# Patient Record
Sex: Female | Born: 1935 | Race: White | Hispanic: No | Smoking: Former smoker
Health system: Southern US, Community
[De-identification: ages and names within clinical notes are randomized; demographics above are authoritative.]

## PROBLEM LIST (undated history)

## (undated) DIAGNOSIS — I429 Cardiomyopathy, unspecified: Secondary | ICD-10-CM

## (undated) DIAGNOSIS — I739 Peripheral vascular disease, unspecified: Secondary | ICD-10-CM

## (undated) DIAGNOSIS — I2510 Atherosclerotic heart disease of native coronary artery without angina pectoris: Secondary | ICD-10-CM

## (undated) DIAGNOSIS — N189 Chronic kidney disease, unspecified: Secondary | ICD-10-CM

## (undated) DIAGNOSIS — E559 Vitamin D deficiency, unspecified: Secondary | ICD-10-CM

## (undated) DIAGNOSIS — E039 Hypothyroidism, unspecified: Secondary | ICD-10-CM

## (undated) DIAGNOSIS — E538 Deficiency of other specified B group vitamins: Secondary | ICD-10-CM

## (undated) DIAGNOSIS — I10 Essential (primary) hypertension: Secondary | ICD-10-CM

## (undated) DIAGNOSIS — E785 Hyperlipidemia, unspecified: Secondary | ICD-10-CM

## (undated) HISTORY — PX: OTHER SURGICAL HISTORY: SHX169

## (undated) HISTORY — PX: ABDOMINAL AORTIC ANEURYSM REPAIR: SUR1152

## (undated) HISTORY — PX: CARDIAC SURGERY: SHX584

---

## 2010-08-12 ENCOUNTER — Ambulatory Visit: Payer: Self-pay | Admitting: Ophthalmology

## 2011-08-05 ENCOUNTER — Ambulatory Visit: Payer: Self-pay | Admitting: Internal Medicine

## 2012-09-15 ENCOUNTER — Ambulatory Visit: Payer: Self-pay | Admitting: Internal Medicine

## 2013-03-16 ENCOUNTER — Ambulatory Visit: Payer: Self-pay | Admitting: Internal Medicine

## 2013-09-27 ENCOUNTER — Inpatient Hospital Stay: Payer: Self-pay | Admitting: Surgery

## 2013-09-28 LAB — BASIC METABOLIC PANEL
Anion Gap: 6 — ABNORMAL LOW (ref 7–16)
BUN: 31 mg/dL — ABNORMAL HIGH (ref 7–18)
CALCIUM: 9 mg/dL (ref 8.5–10.1)
CHLORIDE: 107 mmol/L (ref 98–107)
CO2: 26 mmol/L (ref 21–32)
CREATININE: 2.01 mg/dL — AB (ref 0.60–1.30)
EGFR (African American): 25 — ABNORMAL LOW
EGFR (Non-African Amer.): 21 — ABNORMAL LOW
Glucose: 95 mg/dL (ref 65–99)
OSMOLALITY: 284 (ref 275–301)
Potassium: 4.4 mmol/L (ref 3.5–5.1)
SODIUM: 139 mmol/L (ref 136–145)

## 2013-09-28 LAB — CBC WITH DIFFERENTIAL/PLATELET
Basophil #: 0 10*3/uL (ref 0.0–0.1)
Basophil %: 0.7 %
Eosinophil #: 0 10*3/uL (ref 0.0–0.7)
Eosinophil %: 1 %
HCT: 33.9 % — AB (ref 35.0–47.0)
HGB: 11.6 g/dL — AB (ref 12.0–16.0)
Lymphocyte #: 0.6 10*3/uL — ABNORMAL LOW (ref 1.0–3.6)
Lymphocyte %: 11.2 %
MCH: 34.9 pg — ABNORMAL HIGH (ref 26.0–34.0)
MCHC: 34.3 g/dL (ref 32.0–36.0)
MCV: 102 fL — ABNORMAL HIGH (ref 80–100)
Monocyte #: 0.7 x10 3/mm (ref 0.2–0.9)
Monocyte %: 13.8 %
NEUTROS ABS: 3.7 10*3/uL (ref 1.4–6.5)
NEUTROS PCT: 73.3 %
Platelet: 152 10*3/uL (ref 150–440)
RBC: 3.33 10*6/uL — ABNORMAL LOW (ref 3.80–5.20)
RDW: 13.5 % (ref 11.5–14.5)
WBC: 5 10*3/uL (ref 3.6–11.0)

## 2013-09-28 LAB — TSH: Thyroid Stimulating Horm: 1.12 u[IU]/mL

## 2013-09-29 LAB — BASIC METABOLIC PANEL
Anion Gap: 9 (ref 7–16)
BUN: 27 mg/dL — AB (ref 7–18)
CALCIUM: 8.9 mg/dL (ref 8.5–10.1)
CHLORIDE: 110 mmol/L — AB (ref 98–107)
CO2: 22 mmol/L (ref 21–32)
Creatinine: 1.99 mg/dL — ABNORMAL HIGH (ref 0.60–1.30)
EGFR (African American): 25 — ABNORMAL LOW
EGFR (Non-African Amer.): 22 — ABNORMAL LOW
Glucose: 87 mg/dL (ref 65–99)
Osmolality: 286 (ref 275–301)
POTASSIUM: 4.3 mmol/L (ref 3.5–5.1)
Sodium: 141 mmol/L (ref 136–145)

## 2013-09-30 LAB — CBC WITH DIFFERENTIAL/PLATELET
BASOS ABS: 0 10*3/uL (ref 0.0–0.1)
BASOS PCT: 0.5 %
EOS PCT: 0.4 %
Eosinophil #: 0 10*3/uL (ref 0.0–0.7)
HCT: 33.7 % — ABNORMAL LOW (ref 35.0–47.0)
HGB: 11.4 g/dL — AB (ref 12.0–16.0)
Lymphocyte #: 0.6 10*3/uL — ABNORMAL LOW (ref 1.0–3.6)
Lymphocyte %: 12 %
MCH: 34.6 pg — AB (ref 26.0–34.0)
MCHC: 33.7 g/dL (ref 32.0–36.0)
MCV: 103 fL — AB (ref 80–100)
MONOS PCT: 9.2 %
Monocyte #: 0.4 x10 3/mm (ref 0.2–0.9)
Neutrophil #: 3.6 10*3/uL (ref 1.4–6.5)
Neutrophil %: 77.9 %
PLATELETS: 124 10*3/uL — AB (ref 150–440)
RBC: 3.29 10*6/uL — ABNORMAL LOW (ref 3.80–5.20)
RDW: 12.7 % (ref 11.5–14.5)
WBC: 4.6 10*3/uL (ref 3.6–11.0)

## 2013-09-30 LAB — COMPREHENSIVE METABOLIC PANEL
ALBUMIN: 2.9 g/dL — AB (ref 3.4–5.0)
ALK PHOS: 38 U/L — AB
ALT: 16 U/L (ref 12–78)
ANION GAP: 10 (ref 7–16)
AST: 23 U/L (ref 15–37)
BUN: 24 mg/dL — ABNORMAL HIGH (ref 7–18)
Bilirubin,Total: 0.5 mg/dL (ref 0.2–1.0)
CO2: 20 mmol/L — AB (ref 21–32)
Calcium, Total: 8.3 mg/dL — ABNORMAL LOW (ref 8.5–10.1)
Chloride: 110 mmol/L — ABNORMAL HIGH (ref 98–107)
Creatinine: 1.87 mg/dL — ABNORMAL HIGH (ref 0.60–1.30)
EGFR (Non-African Amer.): 23 — ABNORMAL LOW
GFR CALC AF AMER: 27 — AB
Glucose: 65 mg/dL (ref 65–99)
Osmolality: 282 (ref 275–301)
Potassium: 3.9 mmol/L (ref 3.5–5.1)
Sodium: 140 mmol/L (ref 136–145)
Total Protein: 6.2 g/dL — ABNORMAL LOW (ref 6.4–8.2)

## 2013-10-01 LAB — CBC WITH DIFFERENTIAL/PLATELET
BASOS ABS: 0 10*3/uL (ref 0.0–0.1)
Basophil %: 0.6 %
EOS PCT: 4.8 %
Eosinophil #: 0.3 10*3/uL (ref 0.0–0.7)
HCT: 32.7 % — ABNORMAL LOW (ref 35.0–47.0)
HGB: 11.1 g/dL — AB (ref 12.0–16.0)
LYMPHS ABS: 0.7 10*3/uL — AB (ref 1.0–3.6)
Lymphocyte %: 12.3 %
MCH: 34.6 pg — ABNORMAL HIGH (ref 26.0–34.0)
MCHC: 33.9 g/dL (ref 32.0–36.0)
MCV: 102 fL — ABNORMAL HIGH (ref 80–100)
MONOS PCT: 10.9 %
Monocyte #: 0.6 x10 3/mm (ref 0.2–0.9)
NEUTROS PCT: 71.4 %
Neutrophil #: 4 10*3/uL (ref 1.4–6.5)
Platelet: 129 10*3/uL — ABNORMAL LOW (ref 150–440)
RBC: 3.2 10*6/uL — ABNORMAL LOW (ref 3.80–5.20)
RDW: 13.1 % (ref 11.5–14.5)
WBC: 5.6 10*3/uL (ref 3.6–11.0)

## 2013-10-01 LAB — BASIC METABOLIC PANEL
ANION GAP: 6 — AB (ref 7–16)
BUN: 20 mg/dL — ABNORMAL HIGH (ref 7–18)
CALCIUM: 8.2 mg/dL — AB (ref 8.5–10.1)
CHLORIDE: 106 mmol/L (ref 98–107)
CREATININE: 1.77 mg/dL — AB (ref 0.60–1.30)
Co2: 25 mmol/L (ref 21–32)
EGFR (African American): 29 — ABNORMAL LOW
EGFR (Non-African Amer.): 25 — ABNORMAL LOW
Glucose: 114 mg/dL — ABNORMAL HIGH (ref 65–99)
Osmolality: 277 (ref 275–301)
POTASSIUM: 3.6 mmol/L (ref 3.5–5.1)
Sodium: 137 mmol/L (ref 136–145)

## 2013-10-02 LAB — CBC WITH DIFFERENTIAL/PLATELET
Basophil #: 0 10*3/uL (ref 0.0–0.1)
Basophil %: 0.5 %
Eosinophil #: 0.2 10*3/uL (ref 0.0–0.7)
Eosinophil %: 3.9 %
HCT: 36.2 % (ref 35.0–47.0)
HGB: 12.3 g/dL (ref 12.0–16.0)
Lymphocyte #: 0.8 10*3/uL — ABNORMAL LOW (ref 1.0–3.6)
Lymphocyte %: 15.3 %
MCH: 34.3 pg — ABNORMAL HIGH (ref 26.0–34.0)
MCHC: 33.9 g/dL (ref 32.0–36.0)
MCV: 101 fL — AB (ref 80–100)
MONO ABS: 0.6 x10 3/mm (ref 0.2–0.9)
Monocyte %: 10.9 %
Neutrophil #: 3.5 10*3/uL (ref 1.4–6.5)
Neutrophil %: 69.4 %
Platelet: 128 10*3/uL — ABNORMAL LOW (ref 150–440)
RBC: 3.57 10*6/uL — ABNORMAL LOW (ref 3.80–5.20)
RDW: 13.1 % (ref 11.5–14.5)
WBC: 5 10*3/uL (ref 3.6–11.0)

## 2013-10-02 LAB — BASIC METABOLIC PANEL
Anion Gap: 7 (ref 7–16)
BUN: 13 mg/dL (ref 7–18)
CO2: 24 mmol/L (ref 21–32)
CREATININE: 1.64 mg/dL — AB (ref 0.60–1.30)
Calcium, Total: 8.5 mg/dL (ref 8.5–10.1)
Chloride: 107 mmol/L (ref 98–107)
EGFR (African American): 32 — ABNORMAL LOW
GFR CALC NON AF AMER: 27 — AB
GLUCOSE: 97 mg/dL (ref 65–99)
OSMOLALITY: 276 (ref 275–301)
Potassium: 4.2 mmol/L (ref 3.5–5.1)
Sodium: 138 mmol/L (ref 136–145)

## 2013-10-03 LAB — CBC WITH DIFFERENTIAL/PLATELET
Basophil #: 0 10*3/uL (ref 0.0–0.1)
Basophil %: 0.1 %
Eosinophil #: 0 10*3/uL (ref 0.0–0.7)
Eosinophil %: 0 %
HCT: 34 % — ABNORMAL LOW (ref 35.0–47.0)
HGB: 11.3 g/dL — ABNORMAL LOW (ref 12.0–16.0)
LYMPHS ABS: 0.6 10*3/uL — AB (ref 1.0–3.6)
Lymphocyte %: 8.4 %
MCH: 34 pg (ref 26.0–34.0)
MCHC: 33.3 g/dL (ref 32.0–36.0)
MCV: 102 fL — AB (ref 80–100)
MONO ABS: 0.8 x10 3/mm (ref 0.2–0.9)
MONOS PCT: 11.7 %
NEUTROS ABS: 5.5 10*3/uL (ref 1.4–6.5)
Neutrophil %: 79.8 %
Platelet: 150 10*3/uL (ref 150–440)
RBC: 3.33 10*6/uL — AB (ref 3.80–5.20)
RDW: 13.1 % (ref 11.5–14.5)
WBC: 6.8 10*3/uL (ref 3.6–11.0)

## 2013-10-03 LAB — BASIC METABOLIC PANEL
ANION GAP: 4 — AB (ref 7–16)
BUN: 15 mg/dL (ref 7–18)
CHLORIDE: 107 mmol/L (ref 98–107)
CO2: 24 mmol/L (ref 21–32)
Calcium, Total: 7.9 mg/dL — ABNORMAL LOW (ref 8.5–10.1)
Creatinine: 1.72 mg/dL — ABNORMAL HIGH (ref 0.60–1.30)
EGFR (Non-African Amer.): 26 — ABNORMAL LOW
GFR CALC AF AMER: 30 — AB
GLUCOSE: 110 mg/dL — AB (ref 65–99)
Osmolality: 272 (ref 275–301)
Potassium: 5.3 mmol/L — ABNORMAL HIGH (ref 3.5–5.1)
Sodium: 135 mmol/L — ABNORMAL LOW (ref 136–145)

## 2013-10-04 LAB — MAGNESIUM: MAGNESIUM: 1.2 mg/dL — AB

## 2013-10-04 LAB — CBC WITH DIFFERENTIAL/PLATELET
BASOS PCT: 0.5 %
Basophil #: 0 10*3/uL (ref 0.0–0.1)
EOS ABS: 0.3 10*3/uL (ref 0.0–0.7)
Eosinophil %: 6.7 %
HCT: 31.9 % — ABNORMAL LOW (ref 35.0–47.0)
HGB: 10.5 g/dL — AB (ref 12.0–16.0)
LYMPHS PCT: 18.9 %
Lymphocyte #: 0.9 10*3/uL — ABNORMAL LOW (ref 1.0–3.6)
MCH: 34 pg (ref 26.0–34.0)
MCHC: 33.1 g/dL (ref 32.0–36.0)
MCV: 103 fL — AB (ref 80–100)
MONO ABS: 0.6 x10 3/mm (ref 0.2–0.9)
MONOS PCT: 12.2 %
Neutrophil #: 2.8 10*3/uL (ref 1.4–6.5)
Neutrophil %: 61.7 %
Platelet: 135 10*3/uL — ABNORMAL LOW (ref 150–440)
RBC: 3.1 10*6/uL — AB (ref 3.80–5.20)
RDW: 13.1 % (ref 11.5–14.5)
WBC: 4.5 10*3/uL (ref 3.6–11.0)

## 2013-10-04 LAB — BASIC METABOLIC PANEL
Anion Gap: 2 — ABNORMAL LOW (ref 7–16)
BUN: 13 mg/dL (ref 7–18)
CALCIUM: 7.9 mg/dL — AB (ref 8.5–10.1)
CHLORIDE: 112 mmol/L — AB (ref 98–107)
CO2: 25 mmol/L (ref 21–32)
Creatinine: 1.65 mg/dL — ABNORMAL HIGH (ref 0.60–1.30)
EGFR (Non-African Amer.): 27 — ABNORMAL LOW
GFR CALC AF AMER: 32 — AB
Glucose: 84 mg/dL (ref 65–99)
OSMOLALITY: 277 (ref 275–301)
POTASSIUM: 4.5 mmol/L (ref 3.5–5.1)
SODIUM: 139 mmol/L (ref 136–145)

## 2013-10-04 LAB — PHOSPHORUS: Phosphorus: 2.4 mg/dL — ABNORMAL LOW (ref 2.5–4.9)

## 2013-10-05 LAB — BASIC METABOLIC PANEL
Anion Gap: 7 (ref 7–16)
BUN: 10 mg/dL (ref 7–18)
CALCIUM: 7.6 mg/dL — AB (ref 8.5–10.1)
Chloride: 112 mmol/L — ABNORMAL HIGH (ref 98–107)
Co2: 24 mmol/L (ref 21–32)
Creatinine: 1.38 mg/dL — ABNORMAL HIGH (ref 0.60–1.30)
EGFR (African American): 39 — ABNORMAL LOW
GFR CALC NON AF AMER: 34 — AB
Glucose: 97 mg/dL (ref 65–99)
OSMOLALITY: 284 (ref 275–301)
Potassium: 4.4 mmol/L (ref 3.5–5.1)
Sodium: 143 mmol/L (ref 136–145)

## 2013-10-05 LAB — CBC WITH DIFFERENTIAL/PLATELET
BASOS PCT: 0.7 %
Basophil #: 0 10*3/uL (ref 0.0–0.1)
EOS PCT: 4.9 %
Eosinophil #: 0.2 10*3/uL (ref 0.0–0.7)
HCT: 28.9 % — AB (ref 35.0–47.0)
HGB: 9.7 g/dL — AB (ref 12.0–16.0)
LYMPHS ABS: 0.5 10*3/uL — AB (ref 1.0–3.6)
Lymphocyte %: 12.1 %
MCH: 34.4 pg — AB (ref 26.0–34.0)
MCHC: 33.5 g/dL (ref 32.0–36.0)
MCV: 103 fL — AB (ref 80–100)
MONO ABS: 0.5 x10 3/mm (ref 0.2–0.9)
Monocyte %: 12.3 %
NEUTROS ABS: 3.1 10*3/uL (ref 1.4–6.5)
NEUTROS PCT: 70 %
Platelet: 137 10*3/uL — ABNORMAL LOW (ref 150–440)
RBC: 2.82 10*6/uL — AB (ref 3.80–5.20)
RDW: 13.3 % (ref 11.5–14.5)
WBC: 4.5 10*3/uL (ref 3.6–11.0)

## 2013-10-05 LAB — MAGNESIUM: Magnesium: 1.7 mg/dL — ABNORMAL LOW

## 2013-10-05 LAB — PHOSPHORUS: Phosphorus: 2.7 mg/dL (ref 2.5–4.9)

## 2013-10-06 LAB — BASIC METABOLIC PANEL
ANION GAP: 4 — AB (ref 7–16)
BUN: 7 mg/dL (ref 7–18)
Calcium, Total: 7.7 mg/dL — ABNORMAL LOW (ref 8.5–10.1)
Chloride: 115 mmol/L — ABNORMAL HIGH (ref 98–107)
Co2: 24 mmol/L (ref 21–32)
Creatinine: 1.24 mg/dL (ref 0.60–1.30)
EGFR (African American): 45 — ABNORMAL LOW
EGFR (Non-African Amer.): 38 — ABNORMAL LOW
Glucose: 100 mg/dL — ABNORMAL HIGH (ref 65–99)
Osmolality: 283 (ref 275–301)
POTASSIUM: 4.2 mmol/L (ref 3.5–5.1)
Sodium: 143 mmol/L (ref 136–145)

## 2013-10-06 LAB — CBC WITH DIFFERENTIAL/PLATELET
BASOS ABS: 0 10*3/uL (ref 0.0–0.1)
Basophil %: 1 %
EOS ABS: 0.2 10*3/uL (ref 0.0–0.7)
Eosinophil %: 5.2 %
HCT: 28.2 % — ABNORMAL LOW (ref 35.0–47.0)
HGB: 9.7 g/dL — ABNORMAL LOW (ref 12.0–16.0)
LYMPHS PCT: 15.4 %
Lymphocyte #: 0.6 10*3/uL — ABNORMAL LOW (ref 1.0–3.6)
MCH: 35.1 pg — ABNORMAL HIGH (ref 26.0–34.0)
MCHC: 34.4 g/dL (ref 32.0–36.0)
MCV: 102 fL — AB (ref 80–100)
Monocyte #: 0.5 x10 3/mm (ref 0.2–0.9)
Monocyte %: 12.5 %
NEUTROS ABS: 2.7 10*3/uL (ref 1.4–6.5)
Neutrophil %: 65.9 %
Platelet: 146 10*3/uL — ABNORMAL LOW (ref 150–440)
RBC: 2.76 10*6/uL — ABNORMAL LOW (ref 3.80–5.20)
RDW: 13.3 % (ref 11.5–14.5)
WBC: 4 10*3/uL (ref 3.6–11.0)

## 2013-10-07 LAB — CBC WITH DIFFERENTIAL/PLATELET
BASOS ABS: 0 10*3/uL (ref 0.0–0.1)
BASOS PCT: 0.4 %
EOS PCT: 1.5 %
Eosinophil #: 0.1 10*3/uL (ref 0.0–0.7)
HCT: 33.3 % — ABNORMAL LOW (ref 35.0–47.0)
HGB: 11.2 g/dL — AB (ref 12.0–16.0)
LYMPHS PCT: 8 %
Lymphocyte #: 0.5 10*3/uL — ABNORMAL LOW (ref 1.0–3.6)
MCH: 34.4 pg — ABNORMAL HIGH (ref 26.0–34.0)
MCHC: 33.6 g/dL (ref 32.0–36.0)
MCV: 102 fL — ABNORMAL HIGH (ref 80–100)
Monocyte #: 0.5 x10 3/mm (ref 0.2–0.9)
Monocyte %: 8.2 %
NEUTROS PCT: 81.9 %
Neutrophil #: 4.6 10*3/uL (ref 1.4–6.5)
Platelet: 182 10*3/uL (ref 150–440)
RBC: 3.25 10*6/uL — ABNORMAL LOW (ref 3.80–5.20)
RDW: 12.9 % (ref 11.5–14.5)
WBC: 5.6 10*3/uL (ref 3.6–11.0)

## 2013-10-07 LAB — BASIC METABOLIC PANEL
Anion Gap: 4 — ABNORMAL LOW (ref 7–16)
BUN: 8 mg/dL (ref 7–18)
CALCIUM: 8.3 mg/dL — AB (ref 8.5–10.1)
CHLORIDE: 107 mmol/L (ref 98–107)
Co2: 27 mmol/L (ref 21–32)
Creatinine: 1.37 mg/dL — ABNORMAL HIGH (ref 0.60–1.30)
EGFR (Non-African Amer.): 34 — ABNORMAL LOW
GFR CALC AF AMER: 40 — AB
Glucose: 110 mg/dL — ABNORMAL HIGH (ref 65–99)
Osmolality: 275 (ref 275–301)
Potassium: 4 mmol/L (ref 3.5–5.1)
SODIUM: 138 mmol/L (ref 136–145)

## 2013-10-08 LAB — CBC WITH DIFFERENTIAL/PLATELET
BASOS PCT: 0.4 %
Basophil #: 0 10*3/uL (ref 0.0–0.1)
Eosinophil #: 0.3 10*3/uL (ref 0.0–0.7)
Eosinophil %: 6.4 %
HCT: 31.1 % — ABNORMAL LOW (ref 35.0–47.0)
HGB: 10.5 g/dL — ABNORMAL LOW (ref 12.0–16.0)
LYMPHS PCT: 11.8 %
Lymphocyte #: 0.5 10*3/uL — ABNORMAL LOW (ref 1.0–3.6)
MCH: 34.3 pg — ABNORMAL HIGH (ref 26.0–34.0)
MCHC: 33.8 g/dL (ref 32.0–36.0)
MCV: 102 fL — AB (ref 80–100)
MONOS PCT: 9.9 %
Monocyte #: 0.4 x10 3/mm (ref 0.2–0.9)
Neutrophil #: 3.2 10*3/uL (ref 1.4–6.5)
Neutrophil %: 71.5 %
Platelet: 170 10*3/uL (ref 150–440)
RBC: 3.06 10*6/uL — ABNORMAL LOW (ref 3.80–5.20)
RDW: 13.3 % (ref 11.5–14.5)
WBC: 4.4 10*3/uL (ref 3.6–11.0)

## 2013-10-08 LAB — BASIC METABOLIC PANEL
Anion Gap: 5 — ABNORMAL LOW (ref 7–16)
BUN: 10 mg/dL (ref 7–18)
CALCIUM: 8.2 mg/dL — AB (ref 8.5–10.1)
Chloride: 107 mmol/L (ref 98–107)
Co2: 27 mmol/L (ref 21–32)
Creatinine: 1.32 mg/dL — ABNORMAL HIGH (ref 0.60–1.30)
EGFR (African American): 41 — ABNORMAL LOW
EGFR (Non-African Amer.): 36 — ABNORMAL LOW
GLUCOSE: 91 mg/dL (ref 65–99)
OSMOLALITY: 276 (ref 275–301)
Potassium: 3.7 mmol/L (ref 3.5–5.1)
Sodium: 139 mmol/L (ref 136–145)

## 2013-10-10 LAB — CBC WITH DIFFERENTIAL/PLATELET
Basophil #: 0 10*3/uL (ref 0.0–0.1)
Basophil %: 0.7 %
EOS ABS: 0.3 10*3/uL (ref 0.0–0.7)
EOS PCT: 5.2 %
HCT: 28.6 % — ABNORMAL LOW (ref 35.0–47.0)
HGB: 9.9 g/dL — AB (ref 12.0–16.0)
Lymphocyte #: 0.6 10*3/uL — ABNORMAL LOW (ref 1.0–3.6)
Lymphocyte %: 12.6 %
MCH: 34.5 pg — ABNORMAL HIGH (ref 26.0–34.0)
MCHC: 34.5 g/dL (ref 32.0–36.0)
MCV: 100 fL (ref 80–100)
Monocyte #: 0.5 x10 3/mm (ref 0.2–0.9)
Monocyte %: 10.5 %
Neutrophil #: 3.4 10*3/uL (ref 1.4–6.5)
Neutrophil %: 71 %
Platelet: 182 10*3/uL (ref 150–440)
RBC: 2.86 10*6/uL — AB (ref 3.80–5.20)
RDW: 13.3 % (ref 11.5–14.5)
WBC: 4.8 10*3/uL (ref 3.6–11.0)

## 2013-10-10 LAB — BASIC METABOLIC PANEL
Anion Gap: 1 — ABNORMAL LOW (ref 7–16)
BUN: 11 mg/dL (ref 7–18)
CALCIUM: 8.7 mg/dL (ref 8.5–10.1)
Chloride: 104 mmol/L (ref 98–107)
Co2: 30 mmol/L (ref 21–32)
Creatinine: 1.29 mg/dL (ref 0.60–1.30)
EGFR (African American): 43 — ABNORMAL LOW
EGFR (Non-African Amer.): 37 — ABNORMAL LOW
GLUCOSE: 93 mg/dL (ref 65–99)
OSMOLALITY: 269 (ref 275–301)
Potassium: 3.1 mmol/L — ABNORMAL LOW (ref 3.5–5.1)
SODIUM: 135 mmol/L — AB (ref 136–145)

## 2013-10-10 LAB — MAGNESIUM: MAGNESIUM: 1.2 mg/dL — AB

## 2013-10-11 LAB — POTASSIUM: POTASSIUM: 3.3 mmol/L — AB (ref 3.5–5.1)

## 2013-10-12 LAB — CBC WITH DIFFERENTIAL/PLATELET
BASOS ABS: 0 10*3/uL (ref 0.0–0.1)
BASOS PCT: 0.9 %
EOS PCT: 3.5 %
Eosinophil #: 0.2 10*3/uL (ref 0.0–0.7)
HCT: 31.6 % — AB (ref 35.0–47.0)
HGB: 10.6 g/dL — ABNORMAL LOW (ref 12.0–16.0)
LYMPHS PCT: 19.5 %
Lymphocyte #: 0.9 10*3/uL — ABNORMAL LOW (ref 1.0–3.6)
MCH: 33.5 pg (ref 26.0–34.0)
MCHC: 33.4 g/dL (ref 32.0–36.0)
MCV: 100 fL (ref 80–100)
Monocyte #: 0.4 x10 3/mm (ref 0.2–0.9)
Monocyte %: 8.7 %
NEUTROS PCT: 67.4 %
Neutrophil #: 3.2 10*3/uL (ref 1.4–6.5)
PLATELETS: 228 10*3/uL (ref 150–440)
RBC: 3.15 10*6/uL — ABNORMAL LOW (ref 3.80–5.20)
RDW: 13.1 % (ref 11.5–14.5)
WBC: 4.7 10*3/uL (ref 3.6–11.0)

## 2013-10-12 LAB — POTASSIUM: POTASSIUM: 3.6 mmol/L (ref 3.5–5.1)

## 2013-10-12 LAB — MAGNESIUM: Magnesium: 1.6 mg/dL — ABNORMAL LOW

## 2013-10-16 LAB — BASIC METABOLIC PANEL
Anion Gap: 6 — ABNORMAL LOW (ref 7–16)
BUN: 12 mg/dL (ref 7–18)
CALCIUM: 9.5 mg/dL (ref 8.5–10.1)
CO2: 28 mmol/L (ref 21–32)
Chloride: 103 mmol/L (ref 98–107)
Creatinine: 1.55 mg/dL — ABNORMAL HIGH (ref 0.60–1.30)
EGFR (African American): 34 — ABNORMAL LOW
GFR CALC NON AF AMER: 29 — AB
GLUCOSE: 85 mg/dL (ref 65–99)
OSMOLALITY: 273 (ref 275–301)
POTASSIUM: 4.3 mmol/L (ref 3.5–5.1)
SODIUM: 137 mmol/L (ref 136–145)

## 2013-10-16 LAB — CBC WITH DIFFERENTIAL/PLATELET
BASOS ABS: 0.1 10*3/uL (ref 0.0–0.1)
BASOS PCT: 1 %
EOS PCT: 2.8 %
Eosinophil #: 0.2 10*3/uL (ref 0.0–0.7)
HCT: 35.1 % (ref 35.0–47.0)
HGB: 11.9 g/dL — ABNORMAL LOW (ref 12.0–16.0)
LYMPHS ABS: 1.5 10*3/uL (ref 1.0–3.6)
Lymphocyte %: 28.4 %
MCH: 33.8 pg (ref 26.0–34.0)
MCHC: 34 g/dL (ref 32.0–36.0)
MCV: 100 fL (ref 80–100)
MONOS PCT: 8.5 %
Monocyte #: 0.5 x10 3/mm (ref 0.2–0.9)
Neutrophil #: 3.2 10*3/uL (ref 1.4–6.5)
Neutrophil %: 59.3 %
Platelet: 313 10*3/uL (ref 150–440)
RBC: 3.53 10*6/uL — ABNORMAL LOW (ref 3.80–5.20)
RDW: 13.3 % (ref 11.5–14.5)
WBC: 5.4 10*3/uL (ref 3.6–11.0)

## 2014-12-23 NOTE — Discharge Summary (Signed)
PATIENT NAME:  Alisha Schneider, [name] MR#:  [id number] DATE OF BIRTH:  [birth year]  DATE OF ADMISSION:  09/27/2013 DATE OF DISCHARGE: 10/13/2013   DIAGNOSES:  1.  Small bowel obstruction.  2.  Coronary artery disease. 3.  Hypothyroidism. 4.  Hyperlipidemia. 5.  Chronic renal failure. 6.  Peripheral vascular disease.   PROCEDURES: Exploratory laparotomy with lysis of adhesions by Dr. Michela PitcherEly.   HISTORY OF PRESENT ILLNESS AND HOSPITAL COURSE: This is a patient, who was admitted to the hospital on the 27th for a diagnosis of probable partial small bowel obstruction. A nasogastric tube was placed and she was monitored with repeated exams and abdominal x-rays. Ultimately, she failed to show progression towards improvement; therefore was taken to the operating room by Dr. Michela PitcherEly where lysis of adhesions was performed. Her small bowel obstruction was relieved. Postoperatively, she had a prolonged postoperative ileus requiring nasogastric tube suction, but ultimately the NG tube was removed and she is currently tolerating a regular diet. She is to be transferred to [facility] at her request and we will be discharged on her regular home medications with the addition of Vicodin 1 p.o. q.6 hours p.r.n. pain. Her staples and central line will be removed today and Steri-Strips with Mastisol will be placed. She can start physical therapy and with full weight-bearing and may shower and will follow up in our office with Dr. Michela PitcherEly in 10 days. ____________________________ Adah Salvageichard E. Excell Seltzerooper, MD rec:aw D: 10/13/2013 09:36:56 ET T: 10/13/2013 09:58:33 ET JOB#: [id number]  cc: Adah Salvageichard E. Excell Seltzerooper, MD, <Dictator> Lattie HawICHARD E COOPER MD ELECTRONICALLY SIGNED 10/13/2013 15:38

## 2014-12-23 NOTE — Op Note (Signed)
PATIENT NAME:  Alisha Schneider, Alisha Schneider MR#:  [id number] DATE OF BIRTH:  06-17-1922  DATE OF PROCEDURE:  10/02/2013  PREOPERATIVE DIAGNOSIS: Small bowel obstruction.   POSTOPERATIVE DIAGNOSIS: Small bowel obstruction.   OPERATION: Exploratory laparotomy, lysis of adhesions.   SURGEON: Carmie Endalph L. Ely III, M.D.   ANESTHESIA: General.   OPERATIVE PROCEDURE: With the patient in the supine position after induction of appropriate general anesthesia, the patient'Schneider abdomen was prepped with ChloraPrep and draped with sterile towels. An alcohol wipe and Betadine-impregnated Steri-Drape were utilized. A midline incision was made down through the subcutaneous tissue with Bovie electrocautery. Midline fascia was identified and opened the length of the skin incision. There were multiple adhesions of the anterior abdominal wall from the omentum which were taken down without difficulty. There was significant mismatch in the size of the bowel, and the proximal bowel was dissected down to a point in the pelvis where there was an obvious band adhesion constricting a portion of the distal small bowel. This area was lysed. The bowel did appear to be a bit compromised in that area, so the bowel was observed for 20 to 30 minutes and the bowel appeared to resolve its ischemic changes. There were multiple other small adhesions. Did not appear to be obstructive but were lysed to allow for reduction of future risk of bowel obstruction. The abdomen was copiously irrigated. Bowel contents were returned to their anatomic position. The midline fascia was closed with running suture of looped PDS starting at each end and tying in the middle. Skin was stapled. Sterile dressings were applied. The patient was returned to the recovery room, having tolerated the procedure well. Sponge, instrument and needle counts were correct x 2 in the operating room.   ____________________________ Quentin Orealph L. Ely III, MD rle:gb D: 10/12/2013 01:00:33  ET T: 10/12/2013 01:26:30 ET JOB#: [id number]  cc: Carmie Endalph L. Ely III, MD, <Dictator> Lynnea FerrierBert J. Klein III, MD Quentin OreALPH L ELY MD ELECTRONICALLY SIGNED 10/13/2013 4:01

## 2014-12-23 NOTE — Consult Note (Signed)
PATIENT NAME:  Alisha, Schneider MR#:  [id number] DATE OF BIRTH:  1922/04/06  DATE OF CONSULTATION:  09/29/2013  CONSULTING PHYSICIAN:  Cristal Deer A. Kimberlyann Hollar, MD  CHIEF COMPLAINT: Abdominal pain, nausea, vomiting, radiographic findings for small bowel obstruction.   HISTORY OF PRESENT ILLNESS:  Alisha Schneider is a pleasant 81 year old female who was seen on 01/27 in Dr. Odessa Fleming office for approximately 12 hours of diffuse severe abdominal pain, which came in waves, as well as nausea, vomiting and no bowel movements. She said that this began about 12 hours ago prior to admission.  She had never had an episode like that before and she tried a suppository without results.  She had a CT scan at that time, which showed dilated stomach and proximal loops of small bowel with a change in caliber in her pelvis, similar area as her previous sigmoid colectomy. She was initially treated with n.p.o. and had a bowel movement then her nausea and vomiting resolved and was begun on feeds yesterday, which she did not tolerate. Currently, is still having pain and had 1 episode of emesis approximately 1 hour ago. She says that her emesis makes her pain feel better. She has been offered NG tube prior to this, but has refused. No fevers, chills, night sweats, shortness of breath, cough, chest pain, dysuria or hematuria.   PAST MEDICAL HISTORY: 1. Coronary artery disease status post three-vessel CABG in 1987, status post stent placement in 1997, sees  cardiologist p.r.n.   2.  History of sigmoid colectomy for diverticulitis, date unknown.  3.  Chart has history of prior abdominal aortic aneurysm repair, but she does not recall this.  4.  Hypothyroidism.  5.  Hyperlipidemia.  6.  Vitamin D deficiency.  7.  History of cholelithiasis.  8.  History of chronic kidney disease stage III or IV.   HOME MEDICATIONS:  1.  Aspirin.  2.  Synthroid.  3.  Toprol.  4.  Vitamin D.  5.  Zocor.  6.  Hydrochlorothiazide.   ALLERGIES:  ACE INHIBITORS AND PENICILLIN.   SOCIAL HISTORY: Denies tobacco or alcohol use. Lives alone.   FAMILY HISTORY: Coronary artery disease.   REVIEW OF SYSTEMS: Twelve-point review of systems review of systems was obtained. Pertinent positives and negatives as above.   PHYSICAL EXAMINATION:  VITAL SIGNS:  Currently, temperature 98, pulse of 88, blood pressure 155/88, respirations 20, 90% on room air.  GENERAL: No acute distress.  Alert and oriented x 3.  HEAD: Normocephalic, atraumatic. EYES: No scleral icterus. No conjunctivitis.  FACE: No obvious facial trauma. Normal external nose. Normal external ears.  CHEST: Lungs clear to auscultation. Moving air well.  HEART: Regular rate and rhythm. No murmurs, rubs or gallops.  ABDOMEN:  Soft, mild distention, mild diffuse tenderness without peritoneal signs.  EXTREMITIES: Moves all extremities well. Strength 5/5.  NEUROLOGIC: Cranial nerves II through XII grossly intact. Sensation intact in all 4 extremities.   LABORATORY AND RADIOLOGICAL DATA: Significant for white cell count of 5.0 on January 28th. Platelets 152. Creatinine is 1.99, down from 2.1 on admission.   CT yesterday shows dilated proximal small bowel and change in caliber in pelvis. Significant surgical staples and clips in abdomen.  X-ray from today shows dilated stomach and air-filled small bowel, similar to prior scout image.   ASSESSMENT AND PLAN: This patient is a pleasant 81 year old female who presents with what seems to be persistent small bowel obstruction, likely due to adhesions from previous surgery. I have strongly recommended multiple  times that the potential benefit with NG decompression and I have quoted that with full obstruction she may have 50% chance of resolving this without surgery.  She adamantly refuses it at this time. However, I have been persistent, and I have asked nursing to continue to try to encourage her to have NG tube. I have explained the discomfort with  NG tube, but also that she may end up having an NG tube placed if she ends up undergoing surgery anyway.  We will continue to follow.   ____________________________ Si Raiderhristopher A. Kamirah Shugrue, MD cal:dmm D: 09/29/2013 20:59:32 ET T: 09/29/2013 22:14:02 ET JOB#: [id number]  cc: Cristal Deerhristopher A. Daion Ginsberg, MD, <Dictator> Jarvis NewcomerHRISTOPHER A Alette Kataoka MD ELECTRONICALLY SIGNED 10/03/2013 8:30

## 2014-12-23 NOTE — H&P (Signed)
PATIENT NAME:  Alisha Schneider, Alisha Schneider MR#:  [id number] DATE OF BIRTH:  1922-05-09  DATE OF ADMISSION:  09/27/2013  CHIEF COMPLAINT: Abdominal pain.   HISTORY OF PRESENT ILLNESS: An 81 year old female who was seen in the office today for what was to be routine followup; however, developed abdominal pain yesterday, severe and diffuse. It is coming in cramping waves. Appears more centered over the suprapubic region. She has not had bowel movements for over the last 24 hours or so, and states that sometimes in the past she has had difficulty similar to this, but this is more intense pain. She has developed nausea, vomiting, has not been able to eat anything over the last 24 hours. She tried a Dulcolax suppository with no results. She denies any fevers, chills. On evaluation in the office, she has generalized pain, without guard, rebound, but has had several episodes of vomiting and appears to be in significant discomfort. Labs were obtained, which were unremarkable. Abdominal films revealed air-fluid levels without free air, but a pattern that may be consistent with ileus. She is admitted now with severe abdominal pain with refractory nausea, vomiting. Will place her on observation status.   PAST MEDICAL HISTORY:  1. Coronary artery disease with prior bypass surgery. 2. Vitamin D deficiency. 3. Hypothyroidism.  4. Hyperlipidemia.  5. History of cholelithiasis.  6. Chronic kidney disease stage III to IV. 7. Peripheral vascular disease with prior abdominal aortic aneurysm repair.   ALLERGIES: ACE INHIBITORS, PENICILLIN.   MEDICATIONS:  1. Aspirin 81 mg p.o. daily.  2. Synthroid 0.088 mg p.o. daily.  3. Toprol-XL 100 mg p.o. daily.  4. Vitamin D 2000 units p.o. daily.  5. Simvastatin 40 mg p.o. at bedtime.  6. Hydrochlorothiazide 25 mg p.o. daily.   SOCIAL HISTORY: No alcohol or tobacco.   FAMILY HISTORY: Coronary artery disease.   REVIEW OF SYSTEMS: Please see HPI. Denies urinary changes. No recent  travel or unusual meals. Lives alone. Remainder of complete review of systems is negative.   PHYSICAL EXAMINATION:  VITAL SIGNS: Weight 119, blood pressure 150/80, pulse 76.  GENERAL: Elderly female, appears quite uncomfortable.  EYES: Pupils round and reactive to light. Lids and conjunctivae unremarkable.  EARS, NOSE AND THROAT: External examination unremarkable. The oropharynx is dry without lesions.  NECK: Supple, trachea midline. No thyromegaly.  CARDIOVASCULAR: Regular rate and rhythm without gallops or rubs.  LUNGS: Clear bilaterally without wheeze or retraction. Saturation 93% on room air.  ABDOMEN: Postoperative changes. Slight distention, quiet bowel sounds. Diffuse discomfort without overt guarding or rebound. Negative Murphy'Schneider.  SKIN: No significant rashes or nodules.  LYMPH NODES: No cervical or supraclavicular nodes.  MUSCULOSKELETAL: No clubbing, cyanosis, edema.  NEUROLOGIC: Decreased hearing. Motor strength appears to be symmetrical.   DATA: Urinalysis is negative. Amylase, lipase normal. White count 4.2 with hemoglobin 12.8, platelets 168. Creatinine 1.9. Liver enzymes unremarkable. Glucose 126.   IMPRESSION AND PLAN:  1. Abdominal pain with refractory nausea, vomiting. It may be due to ileus. Will place on IV fluids, hold n.p.o. for now. We will try enema to relieve what may be some retained stool. Will need CT scan, will need to do with oral contrast only as creatinine prohibits use of IV contrast.  2. Hypertension. Will continue Toprol if able to take this. Hold hydrochlorothiazide for now and follow.  3. Coronary artery disease. Continue low-dose aspirin once we make sure there is no significant operative intra-abdominal process going on.   CODE STATUS: The patient has requested do not  resuscitate status, and we will maintain this during hospitalization.   ____________________________ Lynnea Ferrier, MD bjk:lb D: 09/27/2013 12:34:41 ET T: 09/27/2013 12:46:51  ET JOB#: [id number]  cc: Lynnea Ferrier, MD, <Dictator> Daniel Nones MD ELECTRONICALLY SIGNED 09/27/2013 13:39

## 2014-12-23 NOTE — Discharge Summary (Signed)
PATIENT NAME:  Alisha Schneider, Alisha Schneider MR#:  [id number] DATE OF BIRTH:  08-Aug-1922  DATE OF ADMISSION:  09/27/2013 DATE OF DISCHARGE:  10/20/2013  ADDENDUM  Since previous discharge summary on February 12th, Alisha Schneider has been undergoing assessment for skilled nursing facility. She has recently been accepted. She is at the time of discharge taking good p.o. with good p.o. pain control and is voiding and stooling without difficulties. Please see discharge summary from February 12th for the interim between January 27th and February 12th.  ____________________________ Si Raiderhristopher A. Haley Roza, MD cal:sb D: 10/18/2013 12:15:00 ET T: 10/18/2013 12:21:51 ET JOB#: [id number]  cc: Cristal Deerhristopher A. Eliannah Hinde, MD, <Dictator> Jarvis NewcomerHRISTOPHER A Kimberley Dastrup MD ELECTRONICALLY SIGNED 10/20/2013 9:05

## 2016-06-26 ENCOUNTER — Emergency Department: Payer: Medicare PPO

## 2016-06-26 ENCOUNTER — Observation Stay
Admission: EM | Admit: 2016-06-26 | Discharge: 2016-06-27 | Disposition: A | Discharge status: Home / Self Care | Payer: Medicare PPO | Attending: Specialist | Admitting: Specialist

## 2016-06-26 DIAGNOSIS — N189 Chronic kidney disease, unspecified: Secondary | ICD-10-CM | POA: Insufficient documentation

## 2016-06-26 DIAGNOSIS — N289 Disorder of kidney and ureter, unspecified: Secondary | ICD-10-CM

## 2016-06-26 DIAGNOSIS — I1310 Hypertensive heart and chronic kidney disease without heart failure, with stage 1 through stage 4 chronic kidney disease, or unspecified chronic kidney disease: Secondary | ICD-10-CM | POA: Diagnosis not present

## 2016-06-26 DIAGNOSIS — I700 Atherosclerosis of aorta: Secondary | ICD-10-CM | POA: Diagnosis not present

## 2016-06-26 DIAGNOSIS — S2242XA Multiple fractures of ribs, left side, initial encounter for closed fracture: Secondary | ICD-10-CM | POA: Diagnosis not present

## 2016-06-26 DIAGNOSIS — S2239XA Fracture of one rib, unspecified side, initial encounter for closed fracture: Secondary | ICD-10-CM | POA: Diagnosis present

## 2016-06-26 DIAGNOSIS — I2510 Atherosclerotic heart disease of native coronary artery without angina pectoris: Secondary | ICD-10-CM | POA: Insufficient documentation

## 2016-06-26 DIAGNOSIS — E785 Hyperlipidemia, unspecified: Secondary | ICD-10-CM | POA: Insufficient documentation

## 2016-06-26 DIAGNOSIS — E039 Hypothyroidism, unspecified: Secondary | ICD-10-CM | POA: Diagnosis not present

## 2016-06-26 DIAGNOSIS — Z7982 Long term (current) use of aspirin: Secondary | ICD-10-CM | POA: Insufficient documentation

## 2016-06-26 DIAGNOSIS — W1809XA Striking against other object with subsequent fall, initial encounter: Secondary | ICD-10-CM | POA: Diagnosis not present

## 2016-06-26 DIAGNOSIS — I739 Peripheral vascular disease, unspecified: Secondary | ICD-10-CM | POA: Insufficient documentation

## 2016-06-26 DIAGNOSIS — Z87891 Personal history of nicotine dependence: Secondary | ICD-10-CM | POA: Insufficient documentation

## 2016-06-26 DIAGNOSIS — W19XXXA Unspecified fall, initial encounter: Secondary | ICD-10-CM

## 2016-06-26 DIAGNOSIS — Z79899 Other long term (current) drug therapy: Secondary | ICD-10-CM | POA: Insufficient documentation

## 2016-06-26 DIAGNOSIS — R0781 Pleurodynia: Secondary | ICD-10-CM | POA: Diagnosis present

## 2016-06-26 HISTORY — DX: Vitamin D deficiency, unspecified: E55.9

## 2016-06-26 HISTORY — DX: Deficiency of other specified B group vitamins: E53.8

## 2016-06-26 HISTORY — DX: Peripheral vascular disease, unspecified: I73.9

## 2016-06-26 HISTORY — DX: Cardiomyopathy, unspecified: I42.9

## 2016-06-26 HISTORY — DX: Hyperlipidemia, unspecified: E78.5

## 2016-06-26 HISTORY — DX: Hypothyroidism, unspecified: E03.9

## 2016-06-26 HISTORY — DX: Essential (primary) hypertension: I10

## 2016-06-26 HISTORY — DX: Atherosclerotic heart disease of native coronary artery without angina pectoris: I25.10

## 2016-06-26 HISTORY — DX: Chronic kidney disease, unspecified: N18.9

## 2016-06-26 LAB — CBC WITH DIFFERENTIAL/PLATELET
BASOS ABS: 0 10*3/uL (ref 0–0.1)
BASOS PCT: 0 %
Eosinophils Absolute: 0 10*3/uL (ref 0–0.7)
Eosinophils Relative: 0 %
HEMATOCRIT: 39.1 % (ref 35.0–47.0)
HEMOGLOBIN: 13.4 g/dL (ref 12.0–16.0)
Lymphocytes Relative: 8 %
Lymphs Abs: 0.5 10*3/uL — ABNORMAL LOW (ref 1.0–3.6)
MCH: 35 pg — ABNORMAL HIGH (ref 26.0–34.0)
MCHC: 34.3 g/dL (ref 32.0–36.0)
MCV: 102.2 fL — ABNORMAL HIGH (ref 80.0–100.0)
Monocytes Absolute: 0.5 10*3/uL (ref 0.2–0.9)
Monocytes Relative: 8 %
NEUTROS ABS: 5.1 10*3/uL (ref 1.4–6.5)
NEUTROS PCT: 84 %
Platelets: 149 10*3/uL — ABNORMAL LOW (ref 150–440)
RBC: 3.83 MIL/uL (ref 3.80–5.20)
RDW: 14 % (ref 11.5–14.5)
WBC: 6 10*3/uL (ref 3.6–11.0)

## 2016-06-26 LAB — COMPREHENSIVE METABOLIC PANEL
ALBUMIN: 4.6 g/dL (ref 3.5–5.0)
ALK PHOS: 29 U/L — AB (ref 38–126)
ALT: 16 U/L (ref 14–54)
AST: 26 U/L (ref 15–41)
Anion gap: 12 (ref 5–15)
BILIRUBIN TOTAL: 1.4 mg/dL — AB (ref 0.3–1.2)
BUN: 27 mg/dL — AB (ref 6–20)
CO2: 21 mmol/L — ABNORMAL LOW (ref 22–32)
Calcium: 9.2 mg/dL (ref 8.9–10.3)
Chloride: 107 mmol/L (ref 101–111)
Creatinine, Ser: 1.87 mg/dL — ABNORMAL HIGH (ref 0.44–1.00)
GFR calc Af Amer: 26 mL/min — ABNORMAL LOW (ref >60)
GFR calc non Af Amer: 22 mL/min — ABNORMAL LOW (ref >60)
GLUCOSE: 108 mg/dL — AB (ref 65–99)
POTASSIUM: 4.9 mmol/L (ref 3.5–5.1)
Sodium: 140 mmol/L (ref 135–145)
TOTAL PROTEIN: 7.4 g/dL (ref 6.5–8.1)

## 2016-06-26 LAB — URINALYSIS COMPLETE WITH MICROSCOPIC (ARMC ONLY)
BACTERIA UA: NONE SEEN
Bilirubin Urine: NEGATIVE
Glucose, UA: NEGATIVE mg/dL
LEUKOCYTES UA: NEGATIVE
Nitrite: NEGATIVE
PH: 6 (ref 5.0–8.0)
PROTEIN: 30 mg/dL — AB
SPECIFIC GRAVITY, URINE: 1.014 (ref 1.005–1.030)

## 2016-06-26 LAB — TROPONIN I: Troponin I: <0.03 ng/mL (ref <0.03)

## 2016-06-26 MED ORDER — HYDRALAZINE HCL 20 MG/ML IJ SOLN: 10.0000 mg | Freq: Four times a day (QID) | INTRAMUSCULAR | Status: DC | PRN

## 2016-06-26 MED ORDER — HEPARIN SODIUM (PORCINE) 5000 UNIT/ML IJ SOLN
5000.0000 [IU] | Freq: Three times a day (TID) | INTRAMUSCULAR | Status: DC
  Administered 2016-06-26 – 2016-06-27 (×3): 5000 [IU] via SUBCUTANEOUS
  Filled 2016-06-26 (×3): qty 1

## 2016-06-26 MED ORDER — OXYCODONE-ACETAMINOPHEN 5-325 MG PO TABS: 1.0000 | ORAL_TABLET | Freq: Once | ORAL | Status: DC

## 2016-06-26 MED ORDER — SODIUM CHLORIDE 0.9% FLUSH: 3.0000 mL | Freq: Two times a day (BID) | INTRAVENOUS | Status: DC

## 2016-06-26 MED ORDER — ONDANSETRON HCL 4 MG PO TABS: 4.0000 mg | ORAL_TABLET | Freq: Four times a day (QID) | ORAL | Status: DC | PRN

## 2016-06-26 MED ORDER — HYDRALAZINE HCL 25 MG PO TABS
25.0000 mg | ORAL_TABLET | Freq: Every day | ORAL | Status: DC
  Administered 2016-06-26 – 2016-06-27 (×2): 25 mg via ORAL
  Filled 2016-06-26 (×2): qty 1

## 2016-06-26 MED ORDER — SODIUM CHLORIDE 0.9% FLUSH: 3.0000 mL | INTRAVENOUS | Status: DC | PRN

## 2016-06-26 MED ORDER — ACETAMINOPHEN 650 MG RE SUPP: 650.0000 mg | Freq: Four times a day (QID) | RECTAL | Status: DC | PRN

## 2016-06-26 MED ORDER — IBUPROFEN 600 MG PO TABS
600.0000 mg | ORAL_TABLET | Freq: Once | ORAL | Status: AC
  Administered 2016-06-26: 600 mg via ORAL

## 2016-06-26 MED ORDER — HYDROCODONE-ACETAMINOPHEN 5-325 MG PO TABS
1.0000 | ORAL_TABLET | ORAL | Status: DC | PRN
  Administered 2016-06-26: 1 via ORAL
  Administered 2016-06-27: 2 via ORAL
  Administered 2016-06-27: 1 via ORAL
  Filled 2016-06-26: qty 1
  Filled 2016-06-26: qty 2
  Filled 2016-06-26: qty 1

## 2016-06-26 MED ORDER — ONDANSETRON HCL 4 MG/2ML IJ SOLN: 4.0000 mg | Freq: Four times a day (QID) | INTRAMUSCULAR | Status: DC | PRN

## 2016-06-26 MED ORDER — OXYCODONE-ACETAMINOPHEN 5-325 MG PO TABS
1.0000 | ORAL_TABLET | Freq: Once | ORAL | Status: DC
  Filled 2016-06-26: qty 1

## 2016-06-26 MED ORDER — SIMVASTATIN 40 MG PO TABS
40.0000 mg | ORAL_TABLET | Freq: Every day | ORAL | Status: DC
  Administered 2016-06-26 – 2016-06-27 (×2): 40 mg via ORAL
  Filled 2016-06-26 (×2): qty 1

## 2016-06-26 MED ORDER — SODIUM CHLORIDE 0.9 % IV SOLN: 250.0000 mL | INTRAVENOUS | Status: DC | PRN

## 2016-06-26 MED ORDER — IBUPROFEN 600 MG PO TABS
ORAL_TABLET | ORAL | Status: AC
  Administered 2016-06-26: 600 mg via ORAL
  Filled 2016-06-26: qty 1

## 2016-06-26 MED ORDER — ASPIRIN EC 81 MG PO TBEC
81.0000 mg | DELAYED_RELEASE_TABLET | Freq: Every day | ORAL | Status: DC
  Administered 2016-06-26 – 2016-06-27 (×2): 81 mg via ORAL
  Filled 2016-06-26 (×2): qty 1

## 2016-06-26 MED ORDER — ACETAMINOPHEN 325 MG PO TABS: 650.0000 mg | ORAL_TABLET | Freq: Four times a day (QID) | ORAL | Status: DC | PRN

## 2016-06-26 MED ORDER — IBUPROFEN 400 MG PO TABS: 400.0000 mg | ORAL_TABLET | Freq: Once | ORAL | Status: DC

## 2016-06-26 MED ORDER — ONDANSETRON 4 MG PO TBDP: 4.0000 mg | ORAL_TABLET | Freq: Once | ORAL | Status: DC

## 2016-06-26 MED ORDER — ENOXAPARIN SODIUM 40 MG/0.4ML ~~LOC~~ SOLN: 40.0000 mg | SUBCUTANEOUS | Status: DC

## 2016-06-26 MED ORDER — SODIUM CHLORIDE 0.9 % IV SOLN
INTRAVENOUS | Status: DC
  Administered 2016-06-26 – 2016-06-27 (×2): via INTRAVENOUS

## 2016-06-26 MED ORDER — LEVOTHYROXINE SODIUM 125 MCG PO TABS
125.0000 ug | ORAL_TABLET | Freq: Every day | ORAL | Status: DC
  Administered 2016-06-27: 125 ug via ORAL
  Filled 2016-06-26: qty 1

## 2016-06-26 MED ORDER — DOCUSATE SODIUM 100 MG PO CAPS
100.0000 mg | ORAL_CAPSULE | Freq: Two times a day (BID) | ORAL | Status: DC
  Administered 2016-06-26 – 2016-06-27 (×2): 100 mg via ORAL
  Filled 2016-06-26 (×2): qty 1

## 2016-06-26 MED ORDER — HYDROCHLOROTHIAZIDE 25 MG PO TABS
25.0000 mg | ORAL_TABLET | Freq: Every day | ORAL | Status: DC
  Administered 2016-06-26 – 2016-06-27 (×2): 25 mg via ORAL
  Filled 2016-06-26 (×2): qty 1

## 2016-06-26 MED ORDER — METOPROLOL SUCCINATE ER 50 MG PO TB24
50.0000 mg | ORAL_TABLET | Freq: Every day | ORAL | Status: DC
  Administered 2016-06-27: 50 mg via ORAL
  Filled 2016-06-26: qty 1

## 2016-06-26 MED ORDER — OXYCODONE-ACETAMINOPHEN 5-325 MG PO TABS: 1.0000 | ORAL_TABLET | Freq: Four times a day (QID) | ORAL | 0 refills | Status: DC | PRN

## 2016-06-26 NOTE — ED Provider Notes (Addendum)
[facility] Emergency Department Provider Note        Time seen: ----------------------------------------- 9:35 AM on 06/26/2016 -----------------------------------------    I have reviewed the triage vital signs and the nursing notes.   HISTORY  Chief Complaint No chief complaint on file.    HPI Alisha Schneider is a 82 y.o. female brought the ER from home after a fall. Patient states she was moving on the toilet and fell striking her left side on an object. She is complaining of left-sided rib pain. She denies hitting her head or having loss of consciousness, she denies arm or leg pain. She denies recent illness or other complaints at this time. Patient states she is very active and lives alone.   No past medical history on file.  There are no active problems to display for this patient.   No past surgical history on file.  Allergies Review of patient's allergies indicates not on file.  Social History Social History  Substance Use Topics  . Smoking status: Not on file  . Smokeless tobacco: Not on file  . Alcohol use Not on file    Review of Systems Constitutional: Negative for fever. Cardiovascular: Negative for chest pain. Respiratory: Negative for shortness of breath. Gastrointestinal: Negative for abdominal pain, vomiting and diarrhea. Genitourinary: Negative for dysuria. Musculoskeletal: Positive for left rib pain Skin: Negative for rash. Neurological: Negative for headaches, focal weakness or numbness.  10-point ROS otherwise negative.  ____________________________________________   PHYSICAL EXAM:  VITAL SIGNS: ED Triage Vitals  Enc Vitals Group     BP      Pulse      Resp      Temp      Temp src      SpO2      Weight      Height      Head Circumference      Peak Flow      Pain Score      Pain Loc      Pain Edu?      Excl. in GC?     Constitutional: Alert and oriented. Well appearing and in no distress. Eyes:  Conjunctivae are normal. PERRL. Normal extraocular movements. ENT   Head: Normocephalic and atraumatic.   Nose: No congestion/rhinnorhea.   Mouth/Throat: Mucous membranes are moist.   Neck: No stridor. Cardiovascular: Normal rate, regular rhythm. No murmurs, rubs, or gallops. Respiratory: Normal respiratory effort without tachypnea nor retractions. Breath sounds are clear and equal bilaterally. No wheezes/rales/rhonchi. Gastrointestinal: Soft and nontender. Normal bowel sounds Musculoskeletal: Nontender with normal range of motion in all extremities. Left mid axillary rib tenderness Neurologic:  Normal speech and language. No gross focal neurologic deficits are appreciated.  Skin:  Small old ecchymosis noted to the left zygoma Psychiatric: Mood and affect are normal. Speech and behavior are normal.  ___________________________________________  ED COURSE:  Pertinent labs & imaging results that were available during my care of the patient were reviewed by me and considered in my medical decision making (see chart for details). Clinical Course  Procedures   Patient presents to the ER after mechanical fall. We will assess with imaging given oral pain medicine.  ____________________________________________   Labs Reviewed  CBC WITH DIFFERENTIAL/PLATELET - Abnormal; Notable for the following:       Result Value   MCV 102.2 (*)    MCH 35.0 (*)    Platelets 149 (*)    Lymphs Abs 0.5 (*)    All other components  within normal limits  COMPREHENSIVE METABOLIC PANEL  TROPONIN I  URINALYSIS COMPLETEWITH MICROSCOPIC ([facility] ONLY)   EKG: Interpreted by me, sinus rhythm rate of 76 bpm, normal PR interval, wide QRS, normal QT, normal axis, repolarization abnormality  RADIOLOGY Images were viewed by me  rib x-rays  IMPRESSION: Acute left fourth through ninth rib fractures. Negative for pneumothorax.  Cardiomegaly without  edema.  Atherosclerosis. _________________________________________  FINAL ASSESSMENT AND PLAN  Rib fractures, fall  Plan: Patient with imaging as dictated above. She has sustained multiple rib fractures on the left side without any underlying pneumothorax or hemothorax. She is not complaining of significant pain at this time after taking ibuprofen. She has thus far declined pain medicine and has declined hospital admission on multiple occasions. She lives alone and takes care of herself. I offered home health evaluation and she states she will get back to us on that. I'm concerned about her number fractures, and how well she is going to at home but again she has declined admission.   Patient subsequent to try to go home, was in too much pain and requested admission into the hospital. [facility] and discuss with the hospitalist for observation.  Emily FilbertWilliams, Tishanna Dunford E, MD   Note: This dictation was prepared with Dragon dictation. Any transcriptional errors that result from this process are unintentional    Emily FilbertJonathan E Omolara Carol, MD 06/26/16 1115    Emily FilbertJonathan E Basil Blakesley, MD 06/26/16 [id number]    Emily FilbertJonathan E Oriah Leinweber, MD 06/26/16 1230

## 2016-06-26 NOTE — H&P (Signed)
[facility] [place] at [facility]   PATIENT NAME: Alisha Schneider    MR#:  [id number]  DATE OF BIRTH:  09/13/1921  DATE OF ADMISSION:  06/26/2016  PRIMARY CARE PHYSICIAN: [facility]   REQUESTING/REFERRING PHYSICIAN: Dr. Daryel November M.D.  CHIEF COMPLAINT:   Chief Complaint  Patient presents with  . Fall    HISTORY OF PRESENT ILLNESS: Alisha Schneider  is a 82 y.o. female with a known history of B12 deficiency, coronary artery disease, chronic kidney disease, hyperlipidemia, hypertension, hypothyroidism, peripheral vascular disease and vitamin D deficiency who is brought into the hospital after she fell. Patient reports that she was going to the commode when she had a episode where she had a mechanical fall and hit the step of the bathroom tub on the side. Now noticed to have rib fractures and having pain and difficulty with ambulation therefore being admitted for pain control. Patient otherwise denies any nausea vomiting. He complains of sharp chest pain on the left. She also has shortness of breath with deep breathing.  PAST MEDICAL HISTORY:   Past Medical History:  Diagnosis Date  . B12 deficiency   . CAD (coronary artery disease)   . Cardiomyopathy (HCC)   . CKD (chronic kidney disease)   . Hyperlipemia   . Hypertension   . Hypothyroid   . PVD (peripheral vascular disease) (HCC)   . Vitamin D deficiency     PAST SURGICAL HISTORY: Past Surgical History:  Procedure Laterality Date  . ABDOMINAL AORTIC ANEURYSM REPAIR    . CARDIAC SURGERY    . sbo      SOCIAL HISTORY:  Social History  Substance Use Topics  . Smoking status: Former Games developer  . Smokeless tobacco: Never Used  . Alcohol use 0.6 oz/week    1 Glasses of wine per week     Comment: occassionally    FAMILY HISTORY:  Family History  Problem Relation Age of Onset  . Hypertension Mother     DRUG ALLERGIES: No Known Allergies  REVIEW OF SYSTEMS:   CONSTITUTIONAL: No  fever, fatigue or weakness.  EYES: No blurred or double vision.  EARS, NOSE, AND THROAT: No tinnitus or ear pain.  RESPIRATORY: No cough, shortness of breath, wheezing or hemoptysis.  CARDIOVASCULAR: Positive chest pain, orthopnea, edema.  GASTROINTESTINAL: No nausea, vomiting, diarrhea or abdominal pain.  GENITOURINARY: No dysuria, hematuria.  ENDOCRINE: No polyuria, nocturia,  HEMATOLOGY: No anemia, easy bruising or bleeding SKIN: No rash or lesion. MUSCULOSKELETAL: No joint pain or arthritis.   NEUROLOGIC: No tingling, numbness, weakness.  PSYCHIATRY: No anxiety or depression.   MEDICATIONS AT HOME:  Prior to Admission medications   Medication Sig Start Date End Date Taking? Authorizing Provider  aspirin EC 81 MG tablet Take 1 tablet by mouth daily.   Yes Historical Provider, MD  hydrALAZINE (APRESOLINE) 25 MG tablet Take 1 tablet by mouth daily. 06/04/16  Yes Historical Provider, MD  hydrochlorothiazide (HYDRODIURIL) 25 MG tablet Take 1 tablet by mouth daily. 08/07/15  Yes Historical Provider, MD  levothyroxine (SYNTHROID, LEVOTHROID) 125 MCG tablet Take 1 tablet by mouth daily. 05/28/16  Yes Historical Provider, MD  metoprolol succinate (TOPROL-XL) 50 MG 24 hr tablet Take 1 tablet by mouth daily. 02/28/16  Yes Historical Provider, MD  simvastatin (ZOCOR) 40 MG tablet Take 1 tablet by mouth daily. 06/04/16  Yes Historical Provider, MD  oxyCODONE-acetaminophen (PERCOCET) 5-325 MG tablet Take 1-2 tablets by mouth every 6 (six) hours as needed for moderate  pain or severe pain. 06/26/16   Emily Filbert, MD      PHYSICAL EXAMINATION:   VITAL SIGNS: Blood pressure 140/79, pulse 65, temperature 97.7 F (36.5 C), temperature source Oral, resp. rate 18, height 5\' 1"  (1.549 m), weight 110 lb (49.9 kg), SpO2 98 %.  GENERAL:  82 y.o.-year-old patient lying in the bed with no acute distress.  EYES: Pupils equal, round, reactive to light and accommodation. No scleral icterus. Extraocular  muscles intact.  HEENT: Head atraumatic, normocephalic. Oropharynx and nasopharynx clear.  NECK:  Supple, no jugular venous distention. No thyroid enlargement, no tenderness.  LUNGS: Normal breath sounds bilaterally, no wheezing, rales,rhonchi or crepitation. No use of accessory muscles of respiration.  CARDIOVASCULAR: S1, S2 normal. No murmurs, rubs, or gallops.  ABDOMEN: Soft, nontender, nondistended. Bowel sounds present. No organomegaly or mass.  EXTREMITIES: No pedal edema, cyanosis, or clubbing.  NEUROLOGIC: Cranial nerves II through XII are intact. Muscle strength 5/5 in all extremities. Sensation intact. Gait not checked.  PSYCHIATRIC: The patient is alert and oriented x 3.  SKIN: No obvious rash, lesion, or ulcer.   LABORATORY PANEL:   CBC  Recent Labs Lab 06/26/16 1202  WBC 6.0  HGB 13.4  HCT 39.1  PLT 149*  MCV 102.2*  MCH 35.0*  MCHC 34.3  RDW 14.0  LYMPHSABS 0.5*  MONOABS 0.5  EOSABS 0.0  BASOSABS 0.0   ------------------------------------------------------------------------------------------------------------------  Chemistries   Recent Labs Lab 06/26/16 1202  NA 140  K 4.9  CL 107  CO2 21*  GLUCOSE 108*  BUN 27*  CREATININE 1.87*  CALCIUM 9.2  AST 26  ALT 16  ALKPHOS 29*  BILITOT 1.4*   ------------------------------------------------------------------------------------------------------------------ estimated creatinine clearance is 14.8 mL/min (by C-G formula based on SCr of 1.87 mg/dL (H)). ------------------------------------------------------------------------------------------------------------------ No results for input(s): TSH, T4TOTAL, T3FREE, THYROIDAB in the last 72 hours.  Invalid input(s): FREET3   Coagulation profile No results for input(s): INR, PROTIME in the last 168 hours. ------------------------------------------------------------------------------------------------------------------- No results for input(s): DDIMER in  the last 72 hours. -------------------------------------------------------------------------------------------------------------------  Cardiac Enzymes  Recent Labs Lab 06/26/16 1202  TROPONINI <0.03   ------------------------------------------------------------------------------------------------------------------ Invalid input(s): POCBNP  ---------------------------------------------------------------------------------------------------------------  Urinalysis No results found for: COLORURINE, APPEARANCEUR, LABSPEC, PHURINE, GLUCOSEU, HGBUR, BILIRUBINUR, KETONESUR, PROTEINUR, UROBILINOGEN, NITRITE, LEUKOCYTESUR   RADIOLOGY: Dg Ribs Unilateral W/chest Left  Result Date: 06/26/2016 CLINICAL DATA:  Status post fall today with onset of left chest pain. EXAM: LEFT RIBS AND CHEST - 3+ VIEW COMPARISON:  PA and lateral chest 10/09/2013. FINDINGS: There are acute fractures of the left fourth through eighth and likely ninth ribs. The seventh rib fracture demonstrates 1 shaft with lateral displacement of the distal fragment. Small left pleural effusion and mild basilar atelectasis are seen. No pneumothorax. There is cardiomegaly without edema. The patient is status post CABG. Aortic atherosclerosis is noted. IMPRESSION: Acute left fourth through ninth rib fractures. Negative for pneumothorax. Cardiomegaly without edema. Atherosclerosis. Electronically Signed   By: Drusilla Kanner M.D.   On: 06/26/2016 10:46    EKG: Orders placed or performed during the hospital encounter of 06/26/16  . ED EKG  . ED EKG  . EKG 12-Lead  . EKG 12-Lead    IMPRESSION AND PLAN: Patient is a pleasant 82 year old status post trauma to her chest resulting in rib fractures  1. Rib fractures with chest pain Will admit patient under observation For pain control I will last PT to see  2. Hypertension I will continue hydralazine as taking at home and HCTZ  Continue metoprolol I will also add IV hydralazine when  necessary elevated blood pressure  3. Hypothyroidism Continue Synthroid  4. Hyperlipidemia unspecified Continue Zocor  5. Miscellaneous Lovenox for DVT prophylaxis All the records are reviewed and case discussed with ED provider. Management plans discussed with the patient, family and they are in agreement.  CODE STATUS:    Code Status Orders        Start     Ordered   06/26/16 1350  Full code  Continuous     06/26/16 1349    Code Status History    Date Active Date Inactive Code Status Order ID Comments User Context   This patient has a current code status but no historical code status.    Advance Directive Documentation   Flowsheet Row Most Recent Value  Type of Advance Directive  Living will  Pre-existing out of facility DNR order (yellow form or pink MOST form)  No data  "MOST" Form in Place?  No data       TOTAL TIME TAKING CARE OF THIS PATIENT6955minutes.    Auburn BilberryPATEL, Tyshon Fanning M.D on 06/26/2016 at 1:52 PM  Between 7am to 6pm - Pager - [phone]  After 6pm go to [email or web address] - password [email or web address] [facility]  [facility]  Office  [phone]  CC: [facility]; [facility]

## 2016-06-26 NOTE — ED Notes (Signed)
Dr. Mayford KnifeWilliams at bedside to discuss options pt disposition. Pt able to be put back in bed at this time. MD discussed plan of care with patient.

## 2016-06-26 NOTE — Care Management Obs Status (Signed)
MEDICARE OBSERVATION STATUS NOTIFICATION   Patient Details  Name: Alisha NiemannMillie S Moffa MRN: [id number] Date of Birth: 05/04/1922   Medicare Observation Status Notification Given:  Yes    Berna BueCheryl Somaya Grassi, RN 06/26/2016, 1:09 PM

## 2016-06-26 NOTE — ED Notes (Signed)
Into patient room to check on patient. Denies further needs at this time.

## 2016-06-26 NOTE — ED Notes (Signed)
Admitting MD at bedside. Pt still in bed.

## 2016-06-26 NOTE — ED Notes (Signed)
Pt standing in hallway, angry that she cannot find Dr. Mayford KnifeWilliams. Pt states that she needs to speak to him regarding staying in the hospital. Pt recalls that she earlier told him that she wanted to go home, but now wants to stay in hospital due to living alone. Pt reoriented back into room, but states that she will not get into bed. Pt given chair and is sitting at doorway until Dr. Mayford KnifeWilliams can come back into room.

## 2016-06-26 NOTE — ED Notes (Signed)
Pt refused percocet, EDP notified and orders changed..Marland Kitchen

## 2016-06-26 NOTE — ED Notes (Signed)
Pt toileted prior to transfer upstairs. Sent with purse, jacket and shirt.

## 2016-06-26 NOTE — ED Triage Notes (Signed)
Pt comes into the ED via EMS from home with c/o slip and fall in the bathroom around 6am hitting her left ribs on a counter.. Pt is a/ox4.. States she lives alone.. Pt c/o left rib pain..Marland Kitchen

## 2016-06-26 NOTE — ED Notes (Signed)
Care assumed by this RN.

## 2016-06-26 NOTE — ED Notes (Signed)
RN in patient room to discuss further plan of care. Pt denies that MD was in room just prior to RN coming. Pt states that someone else told her about need for EKG and bloodwork. Pt states pain is "much better" than on arrival. States 3/10. PIV inserted at this time and bloodwork obtained. EKG completed. Pt denies standing at doorway as described earlier. Pt pleasant cooperative and appreciative. Given callbell, instructed and return demonstrate of use, TV on, VS taken and warm blankets to cover patient up.

## 2016-06-26 NOTE — ED Notes (Signed)
Report to Erica, RN on 2C. 

## 2016-06-26 NOTE — ED Notes (Signed)
Posey alarm placed on patient for precaution. Pt has not tried to get out of bed since.

## 2016-06-26 NOTE — ED Notes (Signed)
Pt states that she did not take her AM medications today. Pt states that she does took hypertension medication.

## 2016-06-27 LAB — CBC
HCT: 36.2 % (ref 35.0–47.0)
Hemoglobin: 11.9 g/dL — ABNORMAL LOW (ref 12.0–16.0)
MCH: 34.5 pg — AB (ref 26.0–34.0)
MCHC: 32.9 g/dL (ref 32.0–36.0)
MCV: 104.8 fL — ABNORMAL HIGH (ref 80.0–100.0)
PLATELETS: 126 10*3/uL — AB (ref 150–440)
RBC: 3.45 MIL/uL — AB (ref 3.80–5.20)
RDW: 14.1 % (ref 11.5–14.5)
WBC: 4 10*3/uL (ref 3.6–11.0)

## 2016-06-27 LAB — BASIC METABOLIC PANEL
Anion gap: 6 (ref 5–15)
BUN: 29 mg/dL — AB (ref 6–20)
CO2: 24 mmol/L (ref 22–32)
CREATININE: 1.93 mg/dL — AB (ref 0.44–1.00)
Calcium: 8.5 mg/dL — ABNORMAL LOW (ref 8.9–10.3)
Chloride: 111 mmol/L (ref 101–111)
GFR calc Af Amer: 25 mL/min — ABNORMAL LOW (ref >60)
GFR, EST NON AFRICAN AMERICAN: 22 mL/min — AB (ref >60)
Glucose, Bld: 91 mg/dL (ref 65–99)
POTASSIUM: 5 mmol/L (ref 3.5–5.1)
SODIUM: 141 mmol/L (ref 135–145)

## 2016-06-27 MED ORDER — HYDROCODONE-ACETAMINOPHEN 5-325 MG PO TABS: 1.0000 | ORAL_TABLET | Freq: Four times a day (QID) | ORAL | 0 refills | Status: AC | PRN

## 2016-06-27 NOTE — Clinical Social Work Note (Signed)
Please refer to RN CM note regarding discharge disposition. CSW had gone back in to speak with patient to request her allow CSW to begin rehab placement as she was telling staff she had no where to go now when she learned she was to be discharged. Patient adamantly refused rehab. Patient wanted to stay 2 more days in the hospital but stated no reason for this. It was explained to patient that she was medically cleared for discharge. Patient stated that she did not have heat. Once again, CSW offered rehab for her and encouraged her for rehab however patient was still adamant she would not consider rehab. Patient ultimately decided to return home and stated to RN CM that she has a key to get in the home. After CSW left, patient got herself dressed and walked out to nursing station independently. RN CM has arranged transportation to return home. CSW will call DSS APS due to patient stating she has no heat.  York SpanielMonica Aris Moman MSW,LCSW [phone]

## 2016-06-27 NOTE — Progress Notes (Signed)
IV was removed. Discharge instructions, follow-up appointments, and prescriptions were provided to the pt. The pt will transport home via EMS. EMS has been called. Pt is dressed and ready. NT went into room to assist pt with dressing herself, and pt had already put her pants on by herself. Pt is able to get out of bed without assistance.

## 2016-06-27 NOTE — Evaluation (Signed)
Physical Therapy Evaluation Patient Details Name: Alisha Schneider MRN: [id number] DOB: June 16, 1922 Today's Date: 06/27/2016   History of Present Illness  Pt is a 82 y/o female presenting to the ED 06/26/16 after tripping and falling in her bathroom. Pt found to have fractures left ribs 4-9 on x-ray 06/26/16. PMH of vitamin B12 deficiency, CAD s/p CABG, CKD, HLD, HTN, hypothyroidism, PVD, and vitamin D deficiency.   Clinical Impression  Pt lives in a 2-level home (main level with bedroom/bathroom and basement with laundry room). At baseline pt is independent with all community level mobility, ADL's, and IADL's. At this time pt demonstrates decreased AROM with L UE that is not WFL's secondary to sharp increase in L side pain. B LE strength and AROM WFL's. Pt requiring +1 mod assist to complete bed mobility and is unable to get OOB on the L side. Pt requiring +1 min guard to transfer and ambulate 166ft using RW with noted unsteadiness requiring min vc's to increase steadiness. Pt O2 sats drop to 89% with ambulation and improve within 30 seconds to 91%. Pt will benefit from continued PT in order to address the above noted impairments. At this time pt does not demonstrate safe ability to return home alone.     Follow Up Recommendations SNF    Equipment Recommendations  Rolling walker with 5" wheels    Recommendations for Other Services       Precautions / Restrictions Precautions Precautions: Fall Restrictions Weight Bearing Restrictions: No      Mobility  Bed Mobility Overal bed mobility: Needs Assistance Bed Mobility: Supine to Sit;Sit to Supine     Supine to sit: Mod assist;HOB elevated (HOB slightly elevated) Sit to supine: Min guard   General bed mobility comments: pt is unable to complete bed mobility to the L side secondary to increased pain; pt requiring min vc's to correct sitting posture and +1 mod assist to scoot to EOB in order to have B feet supported on the  floor  Transfers Overall transfer level: Needs assistance Equipment used: Rolling walker (2 wheeled) Transfers: Sit to/from Stand Sit to Stand: Min guard         General transfer comment: min guard for safety; pt demonstrates good hand placement with tranfer, needs min vc's to correct standing posture  Ambulation/Gait Ambulation/Gait assistance: Min guard Ambulation Distance (Feet): 110 Feet Assistive device: Rolling walker (2 wheeled) Gait Pattern/deviations: Step-through pattern;Drifts right/left;Trunk flexed   Gait velocity interpretation: at or above normal speed for age/gender General Gait Details: pt requires min vc's to stay within RW, pt unsteady throughout gait with 1 small self-corrected LOB; pt requiring increased time to make full turns and does so over a larger area; pt reports increased difficulty breathing with exertional activity  Stairs Stairs:  (Pt declined at this time secondary to pain with exertion; pt very fearful of steps at this time)          Wheelchair Mobility    Modified Rankin (Stroke Patients Only)       Balance Overall balance assessment: Needs assistance;History of Falls Sitting-balance support: Feet supported;Single extremity supported Sitting balance-Leahy Scale: Good Sitting balance - Comments: trunk flexed and R lateral lean in sitting with no LOB; initially min assist for posterior/R lateral lean with sitting EOB; supervision with min vc's to correct sitting posture Postural control: Right lateral lean;Posterior lean Standing balance support: Bilateral upper extremity supported;During functional activity Standing balance-Leahy Scale: Poor Standing balance comment: Pt requires B UE support on RW in order  to safely ambulate with min guard and vc's to correct unsteadiness; pt standing balance as follows with no UE support (able to stand with feet apart and feet together eyes open min guard with no LOB 10+ secs; pt requiring assist  correcting posterior lean x1 with standing feet apart eyes closed 10+ secs; pt able to tandem stance with increased sway <10 secs)                             Pertinent Vitals/Pain Pain Assessment: 0-10 Pain Score: 8  Pain Location: L side of trunk Pain Descriptors / Indicators: Sore;Sharp Pain Intervention(s): Limited activity within patient's tolerance;Monitored during session;Premedicated before session;Repositioned  O2 sats 93% at rest; lowest 89% on room air. Pt HR 74-88 bpm throughout session.    Home Living Family/patient expects to be discharged to:: Private residence Living Arrangements: Alone   Type of Home: House Home Access: Stairs to enter Entrance Stairs-Rails: Can reach both Entrance Stairs-Number of Steps: 4 Home Layout: Laundry or work area in basement;Able to live on main level with bedroom/bathroom Home Equipment: Environmental consultant - 4 wheels;Cane - single point;Shower seat Additional Comments: 14 steps with 2 rails to enter/exit the basement, which pt reports frequently doing; pt drives self    Prior Function Level of Independence: Independent         Comments: Pt reports having AD if needed but typically does not use any to ambulate; pt is independent with all ADL's and IADL's     Hand Dominance   Dominant Hand: Right    Extremity/Trunk Assessment   Upper Extremity Assessment: LUE deficits/detail (R UE AROM WFL's; at least 3/5 strength)       LUE Deficits / Details: L shoulder flexion AROM grossly 20 degrees and PROM grossly 50-60 degrees secondary to increased pain; pain in L side with gross grip strength (WFL's still); decreased L elbow AROM extension secondary to increased L side pain   Lower Extremity Assessment: Overall WFL for tasks assessed         Communication   Communication: No difficulties  Cognition Arousal/Alertness: Awake/alert Behavior During Therapy: WFL for tasks assessed/performed Overall Cognitive Status: Within  Functional Limits for tasks assessed                      General Comments General comments (skin integrity, edema, etc.): Pt is agreeable to PT session.     Exercises     Assessment/Plan    PT Assessment Patient needs continued PT services  PT Problem List Decreased strength;Decreased range of motion;Decreased activity tolerance;Decreased balance;Decreased mobility;Decreased knowledge of use of DME;Decreased safety awareness;Pain          PT Treatment Interventions DME instruction;Gait training;Stair training;Therapeutic activities;Therapeutic exercise;Balance training;Patient/family education    PT Goals (Current goals can be found in the Care Plan section)  Acute Rehab PT Goals Patient Stated Goal: To have decreased pain and to not return home until it is fully safe to do so. PT Goal Formulation: With patient Time For Goal Achievement: 07/11/16 Potential to Achieve Goals: Good    Frequency Min 2X/week   Barriers to discharge        Co-evaluation               End of Session Equipment Utilized During Treatment: Gait belt Activity Tolerance: Patient tolerated treatment well Patient left: in bed;with call bell/phone within reach;with bed alarm set;with SCD's reapplied (B LE pumps applied and  activated; B heels suspended) Nurse Communication: Mobility status;Precautions         Time: [id number] PT Time Calculation (min) (ACUTE ONLY): 43 min   Charges:         PT G Codes:        Naimah Yingst, SPT 06/27/2016, 1:32 PM

## 2016-06-27 NOTE — Care Management (Signed)
Patient declined private pay taxi

## 2016-06-27 NOTE — Discharge Summary (Addendum)
[employer] - [place] [facility]   PATIENT NAME: Alisha Schneider    MR#:  [id number]  DATE OF BIRTH:  02/21/1922  DATE OF ADMISSION:  06/26/2016 ADMITTING PHYSICIAN: Auburn BilberryShreyang Patel, MD  DATE OF DISCHARGE: 06/27/2016  [facility]    ADMISSION DIAGNOSIS:  Renal insufficiency [N28.9] Fall, initial encounter [W19.XXXA] Closed fracture of multiple ribs of left side, initial encounter [S22.42XA]  DISCHARGE DIAGNOSIS:  Active Problems:   Rib fracture   SECONDARY DIAGNOSIS:   Past Medical History:  Diagnosis Date  . B12 deficiency   . CAD (coronary artery disease)   . Cardiomyopathy (HCC)   . CKD (chronic kidney disease)   . Hyperlipemia   . Hypertension   . Hypothyroid   . PVD (peripheral vascular disease) (HCC)   . Vitamin D deficiency     HOSPITAL COURSE:   82 year old female with past medical history of hypertension, hypothyroidism, hyperlipidemia who presented to the hospital after a fall and noted to have rib fractures.  1. Status post fall and rib fractures-patient was admitted to the hospital under observation for pain control.  -Patient was seen by physical therapy and was able to ambulate well and therefore was going to be discharged with Home health services but she is refusing. She also refuses to placed in SNF for short term basis.  - She will be given some Vicodin for pain control due to her rib fractures.  2. Rib fractures-no evidence of pneumonia, pneumothorax and chest x-ray.  -Continue incentive spirometer, pain control. Patient would benefit from home health services but she is refusing presently.    3. Essential hypertension-patient will continue her HCTZ, metoprolol, drowsing.  4. Hyperlipidemia-patient will resume her simvastatin.   DISCHARGE CONDITIONS:   Stable  CONSULTS OBTAINED:    DRUG ALLERGIES:  No Known Allergies  DISCHARGE MEDICATIONS:     Medication List    TAKE these  medications   aspirin EC 81 MG tablet Take 1 tablet by mouth daily.   hydrALAZINE 25 MG tablet Commonly known as:  APRESOLINE Take 1 tablet by mouth daily.   hydrochlorothiazide 25 MG tablet Commonly known as:  HYDRODIURIL Take 1 tablet by mouth daily.   HYDROcodone-acetaminophen 5-325 MG tablet Commonly known as:  NORCO/VICODIN Take 1-2 tablets by mouth every 6 (six) hours as needed for moderate pain.   levothyroxine 125 MCG tablet Commonly known as:  SYNTHROID, LEVOTHROID Take 1 tablet by mouth daily.   metoprolol succinate 50 MG 24 hr tablet Commonly known as:  TOPROL-XL Take 1 tablet by mouth daily.   simvastatin 40 MG tablet Commonly known as:  ZOCOR Take 1 tablet by mouth daily.         DISCHARGE INSTRUCTIONS:   DIET:  Cardiac diet  DISCHARGE CONDITION:  Stable  ACTIVITY:  Activity as tolerated  OXYGEN:  Home Oxygen: No.   Oxygen Delivery: room air  DISCHARGE LOCATION:  Home  If you experience worsening of your admission symptoms, develop shortness of breath, life threatening emergency, suicidal or homicidal thoughts you must seek medical attention immediately by calling 911 or calling your MD immediately  if symptoms less severe.  You Must read complete instructions/literature along with all the possible adverse reactions/side effects for all the Medicines you take and that have been prescribed to you. Take any new Medicines after you have completely understood and accpet all the possible adverse reactions/side effects.   Please note  You were cared for by a hospitalist during  your hospital stay. If you have any questions about your discharge medications or the care you received while you were in the hospital after you are discharged, you can call the unit and asked to speak with the hospitalist on call if the hospitalist that took care of you is not available. Once you are discharged, your primary care physician will handle any further medical issues.  Please note that NO REFILLS for any discharge medications will be authorized once you are discharged, as it is imperative that you return to your primary care physician (or establish a relationship with a primary care physician if you do not have one) for your aftercare needs so that they can reassess your need for medications and monitor your lab values.     Today   Still having some pain on left side due to rib fractures.  Not hypoxic. Wants to go home. Does not want to go to SNF.   VITAL SIGNS:  Blood pressure 130/76, pulse 81, temperature 98.2 F (36.8 C), temperature source Oral, resp. rate 16, height 5\' 1"  (1.549 m), weight 49.9 kg (110 lb), SpO2 93 %.  I/O:   Intake/Output Summary (Last 24 hours) at 06/27/16 1403 Last data filed at 06/27/16 1021  Gross per 24 hour  Intake          2197.67 ml  Output              550 ml  Net          1647.67 ml    PHYSICAL EXAMINATION:  GENERAL:  82 y.o.-year-old patient lying lying in the bed with no acute distress.  EYES: Pupils equal, round, reactive to light and accommodation. No scleral icterus. Extraocular muscles intact.  HEENT: Head atraumatic, normocephalic. Oropharynx and nasopharynx clear.  NECK:  Supple, no jugular venous distention. No thyroid enlargement, no tenderness.  LUNGS: Poor resp. Effort due to splinting, no wheezing, rales,rhonchi. No use of accessory muscles of respiration.  CARDIOVASCULAR: S1, S2 normal. No murmurs, rubs, or gallops.  ABDOMEN: Soft, non-tender, non-distended. Bowel sounds present. No organomegaly or mass.  EXTREMITIES: No pedal edema, cyanosis, or clubbing.  NEUROLOGIC: Cranial nerves II through XII are intact. No focal motor or sensory defecits b/l.  PSYCHIATRIC: The patient is alert and oriented x 3. Good affect.  SKIN: No obvious rash, lesion, or ulcer.   DATA REVIEW:   CBC  Recent Labs Lab 06/27/16 0451  WBC 4.0  HGB 11.9*  HCT 36.2  PLT 126*    Chemistries   Recent Labs Lab  06/26/16 1202 06/27/16 0451  NA 140 141  K 4.9 5.0  CL 107 111  CO2 21* 24  GLUCOSE 108* 91  BUN 27* 29*  CREATININE 1.87* 1.93*  CALCIUM 9.2 8.5*  AST 26  --   ALT 16  --   ALKPHOS 29*  --   BILITOT 1.4*  --     Cardiac Enzymes  Recent Labs Lab 06/26/16 1202  TROPONINI <0.03    Microbiology Results  No results found for this or any previous visit.  RADIOLOGY:  Dg Ribs Unilateral W/chest Left  Result Date: 06/26/2016 CLINICAL DATA:  Status post fall today with onset of left chest pain. EXAM: LEFT RIBS AND CHEST - 3+ VIEW COMPARISON:  PA and lateral chest 10/09/2013. FINDINGS: There are acute fractures of the left fourth through eighth and likely ninth ribs. The seventh rib fracture demonstrates 1 shaft with lateral displacement of the distal fragment. Small left pleural effusion and mild  basilar atelectasis are seen. No pneumothorax. There is cardiomegaly without edema. The patient is status post CABG. Aortic atherosclerosis is noted. IMPRESSION: Acute left fourth through ninth rib fractures. Negative for pneumothorax. Cardiomegaly without edema. Atherosclerosis. Electronically Signed   By: Drusilla Kanner M.D.   On: 06/26/2016 10:46      Management plans discussed with the patient, family and they are in agreement.  CODE STATUS:     Code Status Orders        Start     Ordered   06/26/16 1350  Full code  Continuous     06/26/16 1349    Code Status History    Date Active Date Inactive Code Status Order ID Comments User Context   This patient has a current code status but no historical code status.    Advance Directive Documentation   Flowsheet Row Most Recent Value  Type of Advance Directive  Living will  Pre-existing out of facility DNR order (yellow form or pink MOST form)  No data  "MOST" Form in Place?  No data      TOTAL TIME TAKING CARE OF THIS PATIENT: 40 minutes.    Houston Siren M.D on 06/27/2016 at 2:03 PM  Between 7am to 6pm - Pager -  [phone]  After 6pm go to [email or web address] - Scientist, research (life sciences) [place] [facility]  Office  [phone]  CC: Primary care physician; [facility]

## 2016-06-27 NOTE — Clinical Social Work Note (Signed)
EMS arrived and told patient that she would receive a bill and told her it would be over $350. Patient told EMS that she could make a payment of $10 per month. EMS stayed in patient's room and discussed price for taxi and their EMS price.[facility] called [facility] and obtain price for taxi of $24. Then EMS presented the options between taxi and EMS.Patient was aware if she wanted to go by taxi she would need to pay for it. RN CM coordinated this with patient and nursing staff. CSW updated Director of CSW regarding situation. CSW notified DSS APS of change in transportation avenue. York SpanielMonica Jethro Radke MSW,LCSW [phone]

## 2016-06-27 NOTE — Clinical Social Work Note (Signed)
PT called CSW and notified that they are recommending STR for patient. CSW spoke with patient again about going to a facility for STR and patient stated that she would not go and declined anywhere but home at discharge. RN CM notified in the event she will be in agreement to home health.  York SpanielMonica Egbert Seidel MSW,LCSW [phone]

## 2016-06-27 NOTE — Clinical Social Work Note (Signed)
CSW contacted DSS APS and spoke with Judithann SaugerPorsha who took the report and stated that they would send a social worker out to the home to check on her. York SpanielMonica Brodey Bonn MSW,LCSW [phone]

## 2016-06-27 NOTE — Clinical Social Work Note (Signed)
Clinical Social Work Assessment  Patient Details  Name: Alisha Schneider MRN: [id number] Date of Birth: 11/10/1921  Date of referral:  06/27/16               Reason for consult:  Facility Placement                Permission sought to share information with:    Permission granted to share information::     Name::        Agency::     Relationship::     Contact Information:     Housing/Transportation Living arrangements for the past 2 months:  Single Family Home Source of Information:  Patient Patient Interpreter Needed:  None Criminal Activity/Legal Involvement Pertinent to Current Situation/Hospitalization:  No - Comment as needed Significant Relationships:  Adult Children Lives with:  Self Do you feel safe going back to the place where you live?  Yes Need for family participation in patient care:  No (Coment)  Care giving concerns:  Patient is independent with ADL's at baseline.    Social Worker assessment / plan:  CSW informed by physician that patient stated that there was something wrong with her water at home and that she couldn't pay her mortgage. CSW also informed that patient refused to work with PT this morning. CSW spoke with patient and explained purpose of visit and role. Patient reports that she lives alone and that she has an estranged son and daughter in law that lives next door. Patient states that even though he lives close by, they do not speak much or see each other that much. Patient reports that her son made it clear after her husband passed away that he was not going to look after her. Patient reports that the checks that she received each month from after her husband's passing have ended but she is able to live off of her own social security. She reports that she still drives and that she goes and gets her necessities. Patient reports that her water has been an issue for many years and that she has a system in place for the past five years in which she boils it to make  sure it is clean for use. She stated that she had a system installed that would clean her well water but it is not working well and has not been working well for 5 years. Patient states that she will work with PT when they return.   Employment status:  Retired Database administratornsurance information:  Managed Medicare PT Recommendations:  Not assessed at this time Information / Referral to community resources:     Patient/Family's Response to care:  Patient expressed appreciation for CSW visit.  Patient/Family's Understanding of and Emotional Response to Diagnosis, Current Treatment, and Prognosis:  Patient reports that the current way she lives is the way she has lived for years. She is not concerned about not being able to meet her needs at home.  Emotional Assessment Appearance:  Appears stated age Attitude/Demeanor/Rapport:   (pleasant and cooperative) Affect (typically observed):  Calm, Appropriate, Pleasant Orientation:  Oriented to Self, Oriented to Place, Oriented to  Time, Oriented to Situation Alcohol / Substance use:  Not Applicable Psych involvement (Current and /or in the community):  No (Comment)  Discharge Needs  Concerns to be addressed:  Care Coordination Readmission within the last 30 days:  No Current discharge risk:  None Barriers to Discharge:  No Barriers Identified   York SpanielMonica Toni Demo, LCSW 06/27/2016, 11:03 AM
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
## 2017-05-12 ENCOUNTER — Emergency Department (HOSPITAL_COMMUNITY)
Admission: EM | Admit: 2017-05-12 | Discharge: 2017-06-01 | Disposition: E | Payer: Medicare PPO | Attending: Emergency Medicine | Admitting: Emergency Medicine

## 2017-05-12 ENCOUNTER — Emergency Department (HOSPITAL_COMMUNITY): Payer: Medicare PPO

## 2017-05-12 DIAGNOSIS — Z79899 Other long term (current) drug therapy: Secondary | ICD-10-CM | POA: Insufficient documentation

## 2017-05-12 DIAGNOSIS — I469 Cardiac arrest, cause unspecified: Secondary | ICD-10-CM | POA: Diagnosis not present

## 2017-05-12 DIAGNOSIS — Y999 Unspecified external cause status: Secondary | ICD-10-CM | POA: Insufficient documentation

## 2017-05-12 DIAGNOSIS — W19XXXA Unspecified fall, initial encounter: Secondary | ICD-10-CM | POA: Insufficient documentation

## 2017-05-12 DIAGNOSIS — R1084 Generalized abdominal pain: Secondary | ICD-10-CM | POA: Insufficient documentation

## 2017-05-12 DIAGNOSIS — Y9389 Activity, other specified: Secondary | ICD-10-CM | POA: Insufficient documentation

## 2017-05-12 DIAGNOSIS — Y929 Unspecified place or not applicable: Secondary | ICD-10-CM | POA: Insufficient documentation

## 2017-05-12 DIAGNOSIS — R51 Headache: Secondary | ICD-10-CM | POA: Diagnosis not present

## 2017-05-12 DIAGNOSIS — S2242XA Multiple fractures of ribs, left side, initial encounter for closed fracture: Secondary | ICD-10-CM | POA: Diagnosis not present

## 2017-05-12 DIAGNOSIS — Y939 Activity, unspecified: Secondary | ICD-10-CM | POA: Diagnosis not present

## 2017-05-12 DIAGNOSIS — T07XXXA Unspecified multiple injuries, initial encounter: Secondary | ICD-10-CM

## 2017-05-12 DIAGNOSIS — Z7982 Long term (current) use of aspirin: Secondary | ICD-10-CM | POA: Insufficient documentation

## 2017-05-12 LAB — ETHANOL

## 2017-05-12 LAB — COMPREHENSIVE METABOLIC PANEL
ALK PHOS: 32 U/L — AB (ref 38–126)
ALT: 33 U/L (ref 14–54)
ANION GAP: 14 (ref 5–15)
AST: 60 U/L — ABNORMAL HIGH (ref 15–41)
Albumin: 3.1 g/dL — ABNORMAL LOW (ref 3.5–5.0)
BUN: 71 mg/dL — ABNORMAL HIGH (ref 6–20)
CALCIUM: 7.9 mg/dL — AB (ref 8.9–10.3)
CO2: 13 mmol/L — AB (ref 22–32)
Chloride: 118 mmol/L — ABNORMAL HIGH (ref 101–111)
Creatinine, Ser: 2.87 mg/dL — ABNORMAL HIGH (ref 0.44–1.00)
GFR calc non Af Amer: 13 mL/min — ABNORMAL LOW (ref >60)
GFR, EST AFRICAN AMERICAN: 15 mL/min — AB (ref >60)
Glucose, Bld: 86 mg/dL (ref 65–99)
POTASSIUM: 5.1 mmol/L (ref 3.5–5.1)
SODIUM: 145 mmol/L (ref 135–145)
TOTAL PROTEIN: 5 g/dL — AB (ref 6.5–8.1)
Total Bilirubin: 1.7 mg/dL — ABNORMAL HIGH (ref 0.3–1.2)

## 2017-05-12 LAB — PROTIME-INR
INR: 1.52
PROTHROMBIN TIME: 18.1 s — AB (ref 11.4–15.2)

## 2017-05-12 LAB — I-STAT ARTERIAL BLOOD GAS, ED
Acid-base deficit: 16 mmol/L — ABNORMAL HIGH (ref 0.0–2.0)
Bicarbonate: 13.7 mmol/L — ABNORMAL LOW (ref 20.0–28.0)
O2 SAT: 100 %
PCO2 ART: 53.2 mmHg — AB (ref 32.0–48.0)
PH ART: 7.018 — AB (ref 7.350–7.450)
PO2 ART: 620 mmHg — AB (ref 83.0–108.0)
Patient temperature: 98
TCO2: 15 mmol/L — AB (ref 22–32)

## 2017-05-12 LAB — PREPARE FRESH FROZEN PLASMA
Unit division: 0
Unit division: 0

## 2017-05-12 LAB — I-STAT CHEM 8, ED
BUN: 69 mg/dL — ABNORMAL HIGH (ref 6–20)
CALCIUM ION: 1.05 mmol/L — AB (ref 1.15–1.40)
CHLORIDE: 118 mmol/L — AB (ref 101–111)
Creatinine, Ser: 2.8 mg/dL — ABNORMAL HIGH (ref 0.44–1.00)
GLUCOSE: 82 mg/dL (ref 65–99)
HCT: 25 % — ABNORMAL LOW (ref 36.0–46.0)
HEMOGLOBIN: 8.5 g/dL — AB (ref 12.0–15.0)
Potassium: 5 mmol/L (ref 3.5–5.1)
SODIUM: 146 mmol/L — AB (ref 135–145)
TCO2: 13 mmol/L — AB (ref 22–32)

## 2017-05-12 LAB — CBC
HEMATOCRIT: 29.9 % — AB (ref 36.0–46.0)
HEMOGLOBIN: 9.6 g/dL — AB (ref 12.0–15.0)
MCH: 31.9 pg (ref 26.0–34.0)
MCHC: 32.1 g/dL (ref 30.0–36.0)
MCV: 99.3 fL (ref 78.0–100.0)
Platelets: 91 10*3/uL — ABNORMAL LOW (ref 150–400)
RBC: 3.01 MIL/uL — AB (ref 3.87–5.11)
RDW: 13.8 % (ref 11.5–15.5)
WBC: 7.3 10*3/uL (ref 4.0–10.5)

## 2017-05-12 LAB — BPAM FFP
BLOOD PRODUCT EXPIRATION DATE: 201809142359
Blood Product Expiration Date: 201809112359
ISSUE DATE / TIME: 201809110301
ISSUE DATE / TIME: 201809110301
UNIT TYPE AND RH: 6200
Unit Type and Rh: 6200

## 2017-05-12 LAB — I-STAT TROPONIN, ED: TROPONIN I, POC: 0.07 ng/mL (ref 0.00–0.08)

## 2017-05-12 LAB — ABO/RH: ABO/RH(D): A POS

## 2017-05-12 LAB — CDS SEROLOGY

## 2017-05-12 LAB — I-STAT CG4 LACTIC ACID, ED: LACTIC ACID, VENOUS: 2.05 mmol/L — AB (ref 0.5–1.9)

## 2017-05-12 MED ORDER — SODIUM CHLORIDE 0.9 % IV BOLUS (SEPSIS)
1000.0000 mL | Freq: Once | INTRAVENOUS | Status: AC
  Administered 2017-05-12: 1000 mL via INTRAVENOUS

## 2017-05-12 MED ORDER — EPINEPHRINE PF 1 MG/10ML IJ SOSY
PREFILLED_SYRINGE | INTRAMUSCULAR | Status: AC | PRN
  Administered 2017-05-12: 0.1 mg via INTRAVENOUS
  Administered 2017-05-12: 1 mg via INTRAVENOUS
  Administered 2017-05-12: 0.1 mg via INTRAVENOUS

## 2017-05-12 MED ORDER — IOPAMIDOL (ISOVUE-300) INJECTION 61%
INTRAVENOUS | Status: AC
  Administered 2017-05-12: 100 mL
  Filled 2017-05-12: qty 100

## 2017-05-12 MED ORDER — ROCURONIUM BROMIDE 50 MG/5ML IV SOLN
INTRAVENOUS | Status: AC | PRN
  Administered 2017-05-12: 100 mg via INTRAVENOUS

## 2017-05-12 MED ORDER — SODIUM BICARBONATE 8.4 % IV SOLN
INTRAVENOUS | Status: AC | PRN
  Administered 2017-05-12: 50 meq via INTRAVENOUS

## 2017-05-12 MED ORDER — ETOMIDATE 2 MG/ML IV SOLN
INTRAVENOUS | Status: AC | PRN
  Administered 2017-05-12: 10 mg via INTRAVENOUS

## 2017-05-12 MED FILL — Medication: Qty: 1 | Status: AC

## 2017-05-13 LAB — BPAM RBC
BLOOD PRODUCT EXPIRATION DATE: 201809152359
Blood Product Expiration Date: 201809232359
ISSUE DATE / TIME: 201809110301
ISSUE DATE / TIME: 201809110301
UNIT TYPE AND RH: 9500
UNIT TYPE AND RH: 9500

## 2017-05-13 LAB — TYPE AND SCREEN
ABO/RH(D): A POS
ANTIBODY SCREEN: NEGATIVE
UNIT DIVISION: 0
Unit division: 0
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
## 2017-06-01 NOTE — ED Notes (Signed)
Patient intubated 21 at the gums, + color change, auscultation done.

## 2017-06-01 NOTE — Progress Notes (Signed)
Dr. Wilkie AyeHorton spoke to family son said she was ready to go to stop all measures.  Extubated pt per MD so family could spend time.

## 2017-06-01 NOTE — ED Notes (Signed)
Patient received NS via EMS.
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
## 2017-06-01 NOTE — ED Notes (Signed)
Order received to hang emergency blood.  Unit [id number] up infusing at 14920ml/hr   Patient has been coded and going to CT for trauma scans

## 2017-06-01 NOTE — ED Notes (Signed)
Dr Wilkie AyeHorton aware of HR 38  At bedside

## 2017-06-01 NOTE — ED Notes (Signed)
Back from CT

## 2017-06-01 NOTE — ED Notes (Signed)
Compressions resumed
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
## 2017-06-01 NOTE — ED Notes (Signed)
Family at beside. Family given emotional support. 

## 2017-06-01 NOTE — ED Notes (Signed)
Pulse check Dr Sheliah HatchKinsinger at bedside, Dr Wilkie AyeHorton talking with family

## 2017-06-01 NOTE — ED Notes (Signed)
Patient take to the morgue

## 2017-06-01 NOTE — ED Notes (Signed)
Pulse check, pulses found in femoral area

## 2017-06-01 NOTE — ED Notes (Signed)
Patient taken to CT, resp present

## 2017-06-01 NOTE — ED Notes (Signed)
Son stated that his Mother has been telling him for years she was ready to go.  Decision was made to discontinue care.  Son and daughter in law present.  Care withdrawn

## 2017-06-01 NOTE — ED Notes (Signed)
Pulse check, Pads placed and CPR resumed

## 2017-06-01 DEATH — deceased
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
